# Patient Record
Sex: Male | Born: 1975 | Race: White | Hispanic: No | Marital: Married | Smoking: Never smoker
Health system: Southern US, Community
[De-identification: ages and names within clinical notes are randomized; demographics above are authoritative.]

## PROBLEM LIST (undated history)

## (undated) DIAGNOSIS — G4730 Sleep apnea, unspecified: Secondary | ICD-10-CM

## (undated) DIAGNOSIS — N200 Calculus of kidney: Secondary | ICD-10-CM

## (undated) DIAGNOSIS — I10 Essential (primary) hypertension: Secondary | ICD-10-CM

## (undated) DIAGNOSIS — R7303 Prediabetes: Secondary | ICD-10-CM

## (undated) DIAGNOSIS — D649 Anemia, unspecified: Secondary | ICD-10-CM

---

## 2006-01-21 ENCOUNTER — Emergency Department (HOSPITAL_COMMUNITY): Admission: EM | Admit: 2006-01-21 | Discharge: 2006-01-21 | Payer: Self-pay | Admitting: Emergency Medicine

## 2012-01-30 ENCOUNTER — Emergency Department (HOSPITAL_COMMUNITY): Payer: BC Managed Care – PPO

## 2012-01-30 ENCOUNTER — Emergency Department (HOSPITAL_COMMUNITY)
Admission: EM | Admit: 2012-01-30 | Discharge: 2012-01-30 | Disposition: A | Discharge status: Home / Self Care | Payer: BC Managed Care – PPO | Attending: Emergency Medicine | Admitting: Emergency Medicine

## 2012-01-30 ENCOUNTER — Encounter (HOSPITAL_COMMUNITY): Payer: Self-pay | Admitting: *Deleted

## 2012-01-30 DIAGNOSIS — N201 Calculus of ureter: Secondary | ICD-10-CM | POA: Insufficient documentation

## 2012-01-30 DIAGNOSIS — R109 Unspecified abdominal pain: Secondary | ICD-10-CM | POA: Insufficient documentation

## 2012-01-30 DIAGNOSIS — K7689 Other specified diseases of liver: Secondary | ICD-10-CM | POA: Insufficient documentation

## 2012-01-30 DIAGNOSIS — I10 Essential (primary) hypertension: Secondary | ICD-10-CM | POA: Insufficient documentation

## 2012-01-30 DIAGNOSIS — N200 Calculus of kidney: Secondary | ICD-10-CM

## 2012-01-30 HISTORY — DX: Essential (primary) hypertension: I10

## 2012-01-30 HISTORY — DX: Calculus of kidney: N20.0

## 2012-01-30 LAB — BASIC METABOLIC PANEL
BUN: 19 mg/dL (ref 6–23)
GFR calc Af Amer: >90 mL/min (ref >90)
Potassium: 4 mEq/L (ref 3.5–5.1)
Sodium: 139 mEq/L (ref 135–145)

## 2012-01-30 LAB — URINALYSIS, ROUTINE W REFLEX MICROSCOPIC
Bilirubin Urine: NEGATIVE
Glucose, UA: NEGATIVE mg/dL
Specific Gravity, Urine: 1.021 (ref 1.005–1.030)

## 2012-01-30 LAB — URINE MICROSCOPIC-ADD ON

## 2012-01-30 MED ORDER — SODIUM CHLORIDE 0.9 % IV BOLUS (SEPSIS)
1000.0000 mL | Freq: Once | INTRAVENOUS | Status: AC
  Administered 2012-01-30: 1000 mL via INTRAVENOUS

## 2012-01-30 MED ORDER — MORPHINE SULFATE 4 MG/ML IJ SOLN
4.0000 mg | Freq: Once | INTRAMUSCULAR | Status: AC
  Administered 2012-01-30: 4 mg via INTRAVENOUS
  Filled 2012-01-30: qty 1

## 2012-01-30 MED ORDER — ONDANSETRON HCL 4 MG/2ML IJ SOLN
4.0000 mg | Freq: Once | INTRAMUSCULAR | Status: AC
  Administered 2012-01-30: 4 mg via INTRAVENOUS
  Filled 2012-01-30: qty 2

## 2012-01-30 MED ORDER — ONDANSETRON 8 MG PO TBDP: 8.0000 mg | ORAL_TABLET | Freq: Three times a day (TID) | ORAL | Status: AC | PRN

## 2012-01-30 MED ORDER — KETOROLAC TROMETHAMINE 30 MG/ML IJ SOLN
30.0000 mg | Freq: Once | INTRAMUSCULAR | Status: AC
  Administered 2012-01-30: 30 mg via INTRAVENOUS
  Filled 2012-01-30: qty 1

## 2012-01-30 MED ORDER — OXYCODONE-ACETAMINOPHEN 5-325 MG PO TABS: 2.0000 | ORAL_TABLET | Freq: Four times a day (QID) | ORAL | Status: AC | PRN

## 2012-01-30 NOTE — ED Provider Notes (Signed)
History     CSN: [id number]  Arrival date & time 01/30/12  1320   First MD Initiated Contact with Patient 01/30/12 1454      Chief Complaint  Patient presents with  . Flank Pain  . Nausea  . Groin Pain    (Consider location/radiation/quality/duration/timing/severity/associated sxs/prior treatment) HPI Patient is a 36 year old male who presents today complaining of right lower flank pain radiating around to the abdomen and into the right testicle. Patient reports his pain as a 10 out of 10 and began today. He admitted to some fracture yesterday and had some pain at that time but says that today this feels quite different. He has a history of nephrolithiasis with largest stone being 8 mm. Patient endorses darkening of his urine as well as difficulty with urination. He denies any dysuria or hematuria. Patient has taken over-the-counter medications without any relief. He endorses nausea but denies any vomiting or fevers. There are no other associated or modifying factors Past Medical History  Diagnosis Date  . Kidney stones   . Hypertension     History reviewed. No pertinent past surgical history.  History reviewed. No pertinent family history.  History  Substance Use Topics  . Smoking status: Never Smoker   . Smokeless tobacco: Not on file  . Alcohol Use: Yes     occ      Review of Systems  Constitutional: Negative.   HENT: Negative.   Eyes: Negative.   Respiratory: Negative.   Cardiovascular: Negative.   Gastrointestinal: Negative.   Genitourinary: Positive for flank pain, decreased urine volume and testicular pain.  Musculoskeletal: Negative.   Skin: Negative.   Neurological: Negative.   Hematological: Negative.   Psychiatric/Behavioral: Negative.   All other systems reviewed and are negative.    Allergies  Review of patient's allergies indicates no known allergies.  Home Medications   Current Outpatient Rx  Name Route Sig Dispense Refill  . FENOFIBRATE  48 MG PO TABS Oral Take 48 mg by mouth daily.    Marland Kitchen NAPROXEN SODIUM 220 MG PO TABS Oral Take 220-440 mg by mouth 2 (two) times daily as needed. For pain      BP 139/82  Pulse 53  Temp(Src) 97.6 F (36.4 C) (Oral)  Resp 18  SpO2 98%  Physical Exam  Nursing note and vitals reviewed. GEN: Well-developed, well-nourished male in no distress, uncomfortable appearing HEENT: Atraumatic, normocephalic. Oropharynx clear without erythema EYES: PERRLA BL, no scleral icterus. NECK: Trachea midline, no meningismus CV: regular rate and rhythm. No murmurs, rubs, or gallops PULM: No respiratory distress.  No crackles, wheezes, or rales. GI: soft, non-tender. No guarding, rebound, or tenderness. + bowel sounds  GU: Right lower flank pain Neuro: cranial nerves 2-12 intact, no abnormalities of strength or sensation, A and O x 3 MSK: Patient moves all 4 extremities symmetrically, no deformity, edema, or injury noted Skin: No rashes petechiae, purpura, or jaundice Psych: no abnormality of mood   ED Course  Procedures (including critical care time)  Labs Reviewed  URINALYSIS, ROUTINE W REFLEX MICROSCOPIC - Abnormal; Notable for the following:    Color, Urine AMBER (*) BIOCHEMICALS MAY BE AFFECTED BY COLOR   APPearance CLOUDY (*)    Hgb urine dipstick LARGE (*)    Protein, ur 30 (*)    All other components within normal limits  BASIC METABOLIC PANEL - Abnormal; Notable for the following:    Glucose, Bld 121 (*)    GFR calc non Af Amer 88 (*)  All other components within normal limits  URINE MICROSCOPIC-ADD ON - Abnormal; Notable for the following:    Bacteria, UA FEW (*)    All other components within normal limits   Ct Abdomen Pelvis Wo Contrast  01/30/2012  *RADIOLOGY REPORT*  Clinical Data: Right flank and lower pelvic pain extending into the right scrotum.  History of nephrolithiasis.  CT ABDOMEN AND PELVIS WITHOUT CONTRAST  Technique:  Multidetector CT imaging of the abdomen and pelvis  was performed following the standard protocol without intravenous contrast.  Comparison: 01/21/2006.  Findings: Diffuse low density of the liver relative to the spleen. Minimal dilatation of the right renal collecting system and ureter to the level of a 4 mm calculus in the proximal ureter.  Normal appearing left kidney.  No calculi seen in either kidney, left ureter or urinary bladder.  Normal appearing appendix in the upper right pelvis.  Normal sized prostate gland.  No gastrointestinal abnormalities or enlarged lymph nodes.  Unremarkable noncontrasted appearance of the spleen, pancreas, gallbladder, adrenal glands and lung bases.  L1 vertebral bone island.  IMPRESSION:  1.  4 mm proximal right ureteral calculus causing minimal right hydronephrosis. 2.  Diffuse hepatic steatosis.  Original Report Authenticated By: Darrol Angel, M.D.     1. Nephrolithiasis       MDM  Patient was evaluated by myself. Based on his history a urinalysis, basic metabolic panel, and CT of the abdomen and pelvis noncontrast ordered. Patient was given IV fluids, morphine, and Zofran. Kidney function was preserved. Patient had largest stone measuring 4 mm on the right. Patient was feeling much better. A dose of Toradol was given. The patient was discharged with Percocet and Zofran as well as information for urology should he require this. Urinalysis showed no signs of infection. Patient was discharged in good condition.        Cyndra Numbers, MD 01/30/12 1744

## 2012-01-30 NOTE — ED Notes (Signed)
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 Pt reports pain is sharp on right flank and shooting into right groin. Pt reports last kidney stone was six years ago. Pt reports more difficult to urinate in the past few days, but denies blood in urine.  Pain is described as pulsing, but constant. Pt reports nausea without vomiting.  Pt has taken OTC pain medications without relief.

## 2012-01-30 NOTE — Discharge Instructions (Signed)

## 2014-03-09 ENCOUNTER — Emergency Department (HOSPITAL_COMMUNITY): Payer: BC Managed Care – PPO | Admitting: Certified Registered"

## 2014-03-09 ENCOUNTER — Encounter (HOSPITAL_COMMUNITY): Payer: Self-pay | Admitting: Emergency Medicine

## 2014-03-09 ENCOUNTER — Ambulatory Visit
Admission: RE | Admit: 2014-03-09 | Discharge: 2014-03-09 | Disposition: A | Discharge status: Home / Self Care | Payer: BC Managed Care – PPO | Source: Ambulatory Visit | Attending: Family Medicine | Admitting: Family Medicine

## 2014-03-09 ENCOUNTER — Ambulatory Visit (HOSPITAL_COMMUNITY)
Admission: EM | Admit: 2014-03-09 | Discharge: 2014-03-10 | Disposition: A | Discharge status: Home / Self Care | Payer: BC Managed Care – PPO | Attending: Emergency Medicine | Admitting: Emergency Medicine

## 2014-03-09 ENCOUNTER — Encounter (HOSPITAL_COMMUNITY): Payer: BC Managed Care – PPO | Admitting: Certified Registered"

## 2014-03-09 ENCOUNTER — Encounter (HOSPITAL_COMMUNITY)
Admission: EM | Disposition: A | Discharge status: Home / Self Care | Payer: Self-pay | Source: Home / Self Care | Attending: Emergency Medicine

## 2014-03-09 ENCOUNTER — Other Ambulatory Visit: Payer: Self-pay | Admitting: Family Medicine

## 2014-03-09 DIAGNOSIS — K3580 Unspecified acute appendicitis: Secondary | ICD-10-CM | POA: Insufficient documentation

## 2014-03-09 DIAGNOSIS — I10 Essential (primary) hypertension: Secondary | ICD-10-CM | POA: Insufficient documentation

## 2014-03-09 DIAGNOSIS — R197 Diarrhea, unspecified: Secondary | ICD-10-CM

## 2014-03-09 DIAGNOSIS — R109 Unspecified abdominal pain: Secondary | ICD-10-CM

## 2014-03-09 DIAGNOSIS — E785 Hyperlipidemia, unspecified: Secondary | ICD-10-CM | POA: Insufficient documentation

## 2014-03-09 DIAGNOSIS — R1031 Right lower quadrant pain: Secondary | ICD-10-CM

## 2014-03-09 HISTORY — PX: LAPAROSCOPIC APPENDECTOMY: SHX408

## 2014-03-09 LAB — CBC WITH DIFFERENTIAL/PLATELET
Basophils Absolute: 0 10*3/uL (ref 0.0–0.1)
Basophils Relative: 0 % (ref 0–1)
EOS PCT: 1 % (ref 0–5)
Eosinophils Absolute: 0.1 10*3/uL (ref 0.0–0.7)
HCT: 38.7 % — ABNORMAL LOW (ref 39.0–52.0)
Hemoglobin: 13.5 g/dL (ref 13.0–17.0)
LYMPHS ABS: 2.6 10*3/uL (ref 0.7–4.0)
LYMPHS PCT: 28 % (ref 12–46)
MCH: 29.4 pg (ref 26.0–34.0)
MCHC: 34.9 g/dL (ref 30.0–36.0)
MCV: 84.3 fL (ref 78.0–100.0)
MONO ABS: 0.5 10*3/uL (ref 0.1–1.0)
MONOS PCT: 6 % (ref 3–12)
NEUTROS PCT: 65 % (ref 43–77)
Neutro Abs: 6 10*3/uL (ref 1.7–7.7)
PLATELETS: 219 10*3/uL (ref 150–400)
RBC: 4.59 MIL/uL (ref 4.22–5.81)
RDW: 12.3 % (ref 11.5–15.5)
WBC: 9.3 10*3/uL (ref 4.0–10.5)

## 2014-03-09 LAB — I-STAT CHEM 8, ED
BUN: 7 mg/dL (ref 6–23)
CHLORIDE: 101 meq/L (ref 96–112)
CREATININE: 1 mg/dL (ref 0.50–1.35)
Calcium, Ion: 1.19 mmol/L (ref 1.12–1.23)
Glucose, Bld: 92 mg/dL (ref 70–99)
HEMATOCRIT: 40 % (ref 39.0–52.0)
HEMOGLOBIN: 13.6 g/dL (ref 13.0–17.0)
Potassium: 3.8 mEq/L (ref 3.7–5.3)
SODIUM: 140 meq/L (ref 137–147)
TCO2: 25 mmol/L (ref 0–100)

## 2014-03-09 SURGERY — APPENDECTOMY, LAPAROSCOPIC
Anesthesia: General | Site: Abdomen

## 2014-03-09 MED ORDER — KCL IN DEXTROSE-NACL 20-5-0.45 MEQ/L-%-% IV SOLN
INTRAVENOUS | Status: DC
  Administered 2014-03-09: 100 mL/h via INTRAVENOUS
  Administered 2014-03-10: 09:00:00 via INTRAVENOUS
  Filled 2014-03-09 (×4): qty 1000

## 2014-03-09 MED ORDER — LIDOCAINE HCL (CARDIAC) 20 MG/ML IV SOLN
INTRAVENOUS | Status: DC | PRN
  Administered 2014-03-09: 100 mg via INTRAVENOUS

## 2014-03-09 MED ORDER — DEXTROSE 5 % IV SOLN
1.0000 g | Freq: Once | INTRAVENOUS | Status: AC
  Administered 2014-03-09: 1 g via INTRAVENOUS

## 2014-03-09 MED ORDER — METRONIDAZOLE IN NACL 5-0.79 MG/ML-% IV SOLN
500.0000 mg | Freq: Once | INTRAVENOUS | Status: AC
  Administered 2014-03-09: 500 mg via INTRAVENOUS

## 2014-03-09 MED ORDER — SUCCINYLCHOLINE CHLORIDE 20 MG/ML IJ SOLN
INTRAMUSCULAR | Status: DC | PRN
  Administered 2014-03-09: 100 mg via INTRAVENOUS

## 2014-03-09 MED ORDER — DIPHENHYDRAMINE HCL 50 MG/ML IJ SOLN: 12.5000 mg | Freq: Four times a day (QID) | INTRAMUSCULAR | Status: DC | PRN

## 2014-03-09 MED ORDER — BUPIVACAINE-EPINEPHRINE 0.25% -1:200000 IJ SOLN
INTRAMUSCULAR | Status: DC | PRN
  Administered 2014-03-09: 10 mL

## 2014-03-09 MED ORDER — IOHEXOL 300 MG/ML  SOLN
100.0000 mL | Freq: Once | INTRAMUSCULAR | Status: AC | PRN
  Administered 2014-03-09: 100 mL via INTRAVENOUS

## 2014-03-09 MED ORDER — KCL IN DEXTROSE-NACL 20-5-0.45 MEQ/L-%-% IV SOLN
INTRAVENOUS | Status: AC
  Filled 2014-03-09: qty 1000

## 2014-03-09 MED ORDER — GLYCOPYRROLATE 0.2 MG/ML IJ SOLN
INTRAMUSCULAR | Status: DC | PRN
Start: 2014-03-09 — End: 2014-03-09
  Administered 2014-03-09: 0.4 mg via INTRAVENOUS
  Administered 2014-03-09: 0.2 mg via INTRAVENOUS

## 2014-03-09 MED ORDER — OXYCODONE-ACETAMINOPHEN 5-325 MG PO TABS
1.0000 | ORAL_TABLET | ORAL | Status: DC | PRN
  Administered 2014-03-10 (×2): 2 via ORAL
  Filled 2014-03-09 (×2): qty 2

## 2014-03-09 MED ORDER — DEXTROSE 5 % IV SOLN
2.0000 g | Freq: Once | INTRAVENOUS | Status: AC
  Administered 2014-03-09: 2 g via INTRAVENOUS
  Filled 2014-03-09: qty 2

## 2014-03-09 MED ORDER — ONDANSETRON HCL 4 MG/2ML IJ SOLN
INTRAMUSCULAR | Status: AC
  Filled 2014-03-09: qty 2

## 2014-03-09 MED ORDER — FENOFIBRATE 54 MG PO TABS
54.0000 mg | ORAL_TABLET | Freq: Every day | ORAL | Status: DC
  Administered 2014-03-09: 54 mg via ORAL
  Filled 2014-03-09 (×2): qty 1

## 2014-03-09 MED ORDER — LACTATED RINGERS IV SOLN
INTRAVENOUS | Status: DC | PRN
  Administered 2014-03-09 (×2): via INTRAVENOUS

## 2014-03-09 MED ORDER — PROMETHAZINE HCL 25 MG/ML IJ SOLN: 6.2500 mg | INTRAMUSCULAR | Status: DC | PRN

## 2014-03-09 MED ORDER — ACETAMINOPHEN 325 MG PO TABS: 325.0000 mg | ORAL_TABLET | Freq: Four times a day (QID) | ORAL | Status: DC | PRN

## 2014-03-09 MED ORDER — BUPIVACAINE-EPINEPHRINE (PF) 0.25% -1:200000 IJ SOLN
INTRAMUSCULAR | Status: AC
  Filled 2014-03-09: qty 30

## 2014-03-09 MED ORDER — HYDROMORPHONE HCL PF 1 MG/ML IJ SOLN
0.2500 mg | INTRAMUSCULAR | Status: DC | PRN
  Administered 2014-03-09 (×2): 0.5 mg via INTRAVENOUS

## 2014-03-09 MED ORDER — SUFENTANIL CITRATE 50 MCG/ML IV SOLN
INTRAVENOUS | Status: DC | PRN
  Administered 2014-03-09 (×2): 10 ug via INTRAVENOUS

## 2014-03-09 MED ORDER — PROPOFOL 10 MG/ML IV BOLUS
INTRAVENOUS | Status: DC | PRN
  Administered 2014-03-09: 160 mg via INTRAVENOUS

## 2014-03-09 MED ORDER — MORPHINE SULFATE 2 MG/ML IJ SOLN: 1.0000 mg | INTRAMUSCULAR | Status: DC | PRN

## 2014-03-09 MED ORDER — NEOSTIGMINE METHYLSULFATE 10 MG/10ML IV SOLN
INTRAVENOUS | Status: AC
  Filled 2014-03-09: qty 1

## 2014-03-09 MED ORDER — GLYCOPYRROLATE 0.2 MG/ML IJ SOLN
INTRAMUSCULAR | Status: AC
Start: 2014-03-09 — End: 2014-03-09
  Filled 2014-03-09: qty 2

## 2014-03-09 MED ORDER — OXYCODONE HCL 5 MG/5ML PO SOLN: 5.0000 mg | Freq: Once | ORAL | Status: DC | PRN

## 2014-03-09 MED ORDER — HYDROMORPHONE HCL PF 1 MG/ML IJ SOLN
INTRAMUSCULAR | Status: AC
  Filled 2014-03-09: qty 1

## 2014-03-09 MED ORDER — SODIUM CHLORIDE 0.9 % IJ SOLN
INTRAMUSCULAR | Status: AC
  Filled 2014-03-09: qty 10

## 2014-03-09 MED ORDER — NEOSTIGMINE METHYLSULFATE 10 MG/10ML IV SOLN
INTRAVENOUS | Status: DC | PRN
  Administered 2014-03-09: 3 mg via INTRAVENOUS

## 2014-03-09 MED ORDER — DEXAMETHASONE SODIUM PHOSPHATE 4 MG/ML IJ SOLN
INTRAMUSCULAR | Status: DC | PRN
Start: 2014-03-09 — End: 2014-03-09
  Administered 2014-03-09: 4 mg via INTRAVENOUS

## 2014-03-09 MED ORDER — ONDANSETRON HCL 4 MG/2ML IJ SOLN: 4.0000 mg | Freq: Four times a day (QID) | INTRAMUSCULAR | Status: DC | PRN

## 2014-03-09 MED ORDER — MIDAZOLAM HCL 2 MG/2ML IJ SOLN
INTRAMUSCULAR | Status: AC
  Filled 2014-03-09: qty 2

## 2014-03-09 MED ORDER — SUFENTANIL CITRATE 50 MCG/ML IV SOLN
INTRAVENOUS | Status: AC
  Filled 2014-03-09: qty 1

## 2014-03-09 MED ORDER — ONDANSETRON HCL 4 MG PO TABS: 4.0000 mg | ORAL_TABLET | Freq: Four times a day (QID) | ORAL | Status: DC | PRN

## 2014-03-09 MED ORDER — ROCURONIUM BROMIDE 100 MG/10ML IV SOLN
INTRAVENOUS | Status: DC | PRN
  Administered 2014-03-09: 30 mg via INTRAVENOUS

## 2014-03-09 MED ORDER — MIDAZOLAM HCL 5 MG/5ML IJ SOLN
INTRAMUSCULAR | Status: DC | PRN
  Administered 2014-03-09: 2 mg via INTRAVENOUS

## 2014-03-09 MED ORDER — KETOROLAC TROMETHAMINE 15 MG/ML IJ SOLN: 15.0000 mg | Freq: Four times a day (QID) | INTRAMUSCULAR | Status: DC | PRN

## 2014-03-09 MED ORDER — METRONIDAZOLE IN NACL 5-0.79 MG/ML-% IV SOLN
500.0000 mg | Freq: Three times a day (TID) | INTRAVENOUS | Status: AC
  Administered 2014-03-09 – 2014-03-10 (×2): 500 mg via INTRAVENOUS
  Filled 2014-03-09 (×2): qty 100

## 2014-03-09 MED ORDER — ENOXAPARIN SODIUM 40 MG/0.4ML ~~LOC~~ SOLN
40.0000 mg | SUBCUTANEOUS | Status: DC
  Filled 2014-03-09: qty 0.4

## 2014-03-09 MED ORDER — KETOROLAC TROMETHAMINE 15 MG/ML IJ SOLN
15.0000 mg | Freq: Four times a day (QID) | INTRAMUSCULAR | Status: DC
  Administered 2014-03-10 (×2): 15 mg via INTRAVENOUS
  Filled 2014-03-09 (×3): qty 1

## 2014-03-09 MED ORDER — OXYCODONE HCL 5 MG PO TABS: 5.0000 mg | ORAL_TABLET | Freq: Once | ORAL | Status: DC | PRN

## 2014-03-09 MED ORDER — SODIUM CHLORIDE 0.9 % IR SOLN
Status: DC | PRN
  Administered 2014-03-09: 1000 mL

## 2014-03-09 MED ORDER — ONDANSETRON HCL 4 MG/2ML IJ SOLN
INTRAMUSCULAR | Status: DC | PRN
  Administered 2014-03-09: 4 mg via INTRAVENOUS

## 2014-03-09 SURGICAL SUPPLY — 39 items
APPLIER CLIP ROT 10 11.4 M/L (STAPLE)
BLADE SURG ROTATE 9660 (MISCELLANEOUS) IMPLANT
CANISTER SUCTION 2500CC (MISCELLANEOUS) ×3 IMPLANT
CHLORAPREP W/TINT 26ML (MISCELLANEOUS) ×3 IMPLANT
CLIP APPLIE ROT 10 11.4 M/L (STAPLE) IMPLANT
COVER SURGICAL LIGHT HANDLE (MISCELLANEOUS) ×3 IMPLANT
CUTTER FLEX LINEAR 45M (STAPLE) ×3 IMPLANT
DERMABOND ADVANCED (GAUZE/BANDAGES/DRESSINGS) ×2
DERMABOND ADVANCED .7 DNX12 (GAUZE/BANDAGES/DRESSINGS) ×1 IMPLANT
DRAPE UTILITY 15X26 W/TAPE STR (DRAPE) ×6 IMPLANT
DRAPE WARM FLUID 44X44 (DRAPE) ×3 IMPLANT
ELECT REM PT RETURN 9FT ADLT (ELECTROSURGICAL) ×3
ELECTRODE REM PT RTRN 9FT ADLT (ELECTROSURGICAL) ×1 IMPLANT
ENDOLOOP SUT PDS II  0 18 (SUTURE)
ENDOLOOP SUT PDS II 0 18 (SUTURE) IMPLANT
GLOVE BIO SURGEON STRL SZ 6 (GLOVE) ×3 IMPLANT
GLOVE BIOGEL PI IND STRL 6.5 (GLOVE) ×1 IMPLANT
GLOVE BIOGEL PI INDICATOR 6.5 (GLOVE) ×2
GOWN STRL REUS W/ TWL LRG LVL3 (GOWN DISPOSABLE) ×2 IMPLANT
GOWN STRL REUS W/TWL 2XL LVL3 (GOWN DISPOSABLE) ×6 IMPLANT
GOWN STRL REUS W/TWL LRG LVL3 (GOWN DISPOSABLE) ×4
KIT BASIN OR (CUSTOM PROCEDURE TRAY) ×3 IMPLANT
KIT ROOM TURNOVER OR (KITS) ×3 IMPLANT
NS IRRIG 1000ML POUR BTL (IV SOLUTION) ×3 IMPLANT
PAD ARMBOARD 7.5X6 YLW CONV (MISCELLANEOUS) ×6 IMPLANT
POUCH SPECIMEN RETRIEVAL 10MM (ENDOMECHANICALS) ×3 IMPLANT
RELOAD STAPLE TA45 3.5 REG BLU (ENDOMECHANICALS) ×3 IMPLANT
SCALPEL HARMONIC ACE (MISCELLANEOUS) ×3 IMPLANT
SET IRRIG TUBING LAPAROSCOPIC (IRRIGATION / IRRIGATOR) ×3 IMPLANT
SLEEVE ENDOPATH XCEL 5M (ENDOMECHANICALS) ×3 IMPLANT
SPECIMEN JAR SMALL (MISCELLANEOUS) ×3 IMPLANT
SUT MNCRL AB 4-0 PS2 18 (SUTURE) ×3 IMPLANT
TOWEL OR 17X24 6PK STRL BLUE (TOWEL DISPOSABLE) ×3 IMPLANT
TOWEL OR 17X26 10 PK STRL BLUE (TOWEL DISPOSABLE) ×3 IMPLANT
TRAY FOLEY CATH 16FR SILVER (SET/KITS/TRAYS/PACK) ×3 IMPLANT
TRAY LAPAROSCOPIC (CUSTOM PROCEDURE TRAY) ×3 IMPLANT
TROCAR XCEL BLUNT TIP 100MML (ENDOMECHANICALS) ×3 IMPLANT
TROCAR XCEL NON-BLD 5MMX100MML (ENDOMECHANICALS) ×3 IMPLANT
WATER STERILE IRR 1000ML POUR (IV SOLUTION) IMPLANT

## 2014-03-09 NOTE — Anesthesia Preprocedure Evaluation (Signed)
Anesthesia Evaluation  Patient identified by MRN, date of birth, ID band Patient awake    Reviewed: Allergy & Precautions, H&P , NPO status , Patient's Chart, lab work & pertinent test results  History of Anesthesia Complications Negative for: history of anesthetic complications  Airway Mallampati: I  Neck ROM: Full    Dental no notable dental hx. (+) Teeth Intact   Pulmonary neg pulmonary ROS,  breath sounds clear to auscultation  Pulmonary exam normal       Cardiovascular hypertension, negative cardio ROS  IRhythm:Regular Rate:Normal     Neuro/Psych negative neurological ROS  negative psych ROS   GI/Hepatic negative GI ROS, Neg liver ROS,   Endo/Other  negative endocrine ROS  Renal/GU negative Renal ROS  negative genitourinary   Musculoskeletal   Abdominal   Peds  Hematology negative hematology ROS (+)   Anesthesia Other Findings   Reproductive/Obstetrics negative OB ROS                           Anesthesia Physical Anesthesia Plan  ASA: I  Anesthesia Plan: General   Post-op Pain Management:    Induction: Intravenous  Airway Management Planned: Oral ETT  Additional Equipment:   Intra-op Plan:   Post-operative Plan: Extubation in OR  Informed Consent: I have reviewed the patients History and Physical, chart, labs and discussed the procedure including the risks, benefits and alternatives for the proposed anesthesia with the patient or authorized representative who has indicated his/her understanding and acceptance.   Dental advisory given  Plan Discussed with: CRNA and Surgeon  Anesthesia Plan Comments:         Anesthesia Quick Evaluation

## 2014-03-09 NOTE — Anesthesia Procedure Notes (Signed)
Procedure Name: Intubation Date/Time: 03/09/2014 7:51 PM Performed by: Claris Che Pre-anesthesia Checklist: Patient identified, Emergency Drugs available, Suction available and Patient being monitored Patient Re-evaluated:Patient Re-evaluated prior to inductionOxygen Delivery Method: Circle system utilized Preoxygenation: Pre-oxygenation with 100% oxygen Intubation Type: IV induction, Rapid sequence and Cricoid Pressure applied Ventilation: Mask ventilation without difficulty Laryngoscope Size: Mac and 3 Grade View: Grade II Tube type: Oral Tube size: 8.0 mm Number of attempts: 1 Airway Equipment and Method: Stylet Placement Confirmation: ETT inserted through vocal cords under direct vision,  positive ETCO2 and breath sounds checked- equal and bilateral Secured at: 23 cm Tube secured with: Tape Dental Injury: Teeth and Oropharynx as per pre-operative assessment

## 2014-03-09 NOTE — ED Provider Notes (Addendum)
Medical screening examination/treatment/procedure(s) were conducted as a shared visit with resident physician and myself.  I personally evaluated the patient during the encounter.  Gradual onset constant mild right lower quadrant pain over the last 4 days with no rebound on examination only mild right lower quadrant tenderness the rest of the abdomen is nontender CT scan shows appendicitis performed prior to patient arrival to the emergency department.  I saw and evaluated the patient, reviewed the resident's note and I agree with the findings and plan.   EKG Interpretation None      Babette Relic, MD 03/10/14 [place], MD 03/10/14 1329

## 2014-03-09 NOTE — ED Provider Notes (Signed)
CSN: [id number]     Arrival date & time 03/09/14  1540 History   First MD Initiated Contact with Patient 03/09/14 1733     Chief Complaint  Patient presents with  . appendicitis      (Consider location/radiation/quality/duration/timing/severity/associated sxs/prior Treatment) HPI   Patient is a 38 year old male with a past medical history of hyperlipidemia and hypertension, who presents at the request of his PCP after having a CT scan earlier today which was diagnostic for acute appendicitis. Patient states that he began having abdominal pain in his right lower quadrant on Tuesday evening. He developed chills on Wednesday evening but has not had any known fevers. No nausea or vomiting. Has had some decreased oral intake. Last ate a meal yesterday evening. He has been drinking fluids today. He has never had any prior abdominal surgeries. He was seen at his primary care provider's office today, and had a CT scan done this afternoon, after which his physician called him and asked him to come to the emergency room as the CT scan showed appendicitis.  Past Medical History  Diagnosis Date  . Kidney stones   . Hypertension    History reviewed. No pertinent past surgical history. No family history on file. History  Substance Use Topics  . Smoking status: Never Smoker   . Smokeless tobacco: Not on file  . Alcohol Use: Yes     Comment: occ    Review of Systems  Constitutional: Positive for chills and appetite change. Negative for fever.  Gastrointestinal: Positive for abdominal pain. Negative for nausea and vomiting.  All other systems reviewed and are negative.     Allergies  Review of patient's allergies indicates no known allergies.  Home Medications   Prior to Admission medications   Medication Sig Start Date End Date Taking? Authorizing Provider  fenofibrate (TRICOR) 48 MG tablet Take 48 mg by mouth daily.    Historical Provider, MD  naproxen sodium (ANAPROX) 220 MG tablet Take  220-440 mg by mouth 2 (two) times daily as needed. For pain    Historical Provider, MD   BP 141/96  Pulse 92  Temp(Src) 98.1 F (36.7 C)  Resp 18  Ht 5\' 10"  (1.778 m)  Wt 216 lb (97.977 kg)  BMI 30.99 kg/m2  SpO2 98% Physical Exam  Constitutional: He is oriented to person, place, and time. He appears well-developed and well-nourished. No distress.  HENT:  Head: Normocephalic and atraumatic.  Mouth/Throat: Oropharynx is clear and moist.  Eyes: Pupils are equal, round, and reactive to light.  Cardiovascular: Normal rate, regular rhythm and normal heart sounds.   Pulmonary/Chest: Effort normal and breath sounds normal.  Abdominal: Soft. He exhibits no distension and no mass. There is tenderness. There is no rebound and no guarding.  TTP RLQ  Musculoskeletal: He exhibits no edema.  Neurological: He is alert and oriented to person, place, and time.  Skin: Skin is warm and dry. He is not diaphoretic.  Psychiatric: He has a normal mood and affect. His behavior is normal.    ED Course  Procedures (including critical care time) Labs Review Labs Reviewed  CBC WITH DIFFERENTIAL - Abnormal; Notable for the following:    HCT 38.7 (*)    All other components within normal limits    Imaging Review Ct Abdomen Pelvis W Contrast  03/09/2014   CLINICAL DATA:  Abdominal pain  EXAM: CT ABDOMEN AND PELVIS WITH CONTRAST  TECHNIQUE: Multidetector CT imaging of the abdomen and pelvis was performed using the  standard protocol following bolus administration of intravenous contrast. Oral contrast was also administered.  CONTRAST:  184mL OMNIPAQUE IOHEXOL 300 MG/ML  SOLN  COMPARISON:  January 30, 2012  FINDINGS: Lung bases are clear.  Liver is enlarged, measuring 22.8 cm in length. There is diffuse hepatic steatosis. No focal liver lesions are identified. There is no appreciable biliary duct dilatation. Gallbladder wall is not thickened.  Spleen is prominent, measuring 13.9 by 14.9 by 7.0 cm. No focal splenic  lesions are identified. There is a small splenule along the anterior inferior spleen.  Pancreas and adrenals appear normal. Kidneys bilaterally show no mass or hydronephrosis. There is no renal or ureteral calculus on either side.  In the pelvis, the urinary bladder is midline. There is no pelvic mass or fluid collection.  The appendix is dilated with periappendiceal inflammation. No abscess is seen in this region. There is some thickening of the wall of the cecum consistent with the adjacent appendicitis.  There is no bowel obstruction.  No free air or portal venous air.  There is no ascites, adenopathy, or abscess in the abdomen or pelvis. There is no evidence of abdominal aortic aneurysm. There are no blastic or lytic bone lesions.  IMPRESSION: Evidence of acute appendicitis.  No abscess.  Diffuse hepatic steatosis.  There is hepatomegaly and splenomegaly.  Critical Value/emergent results were called by telephone at the time of interpretation on 03/09/2014 at 3:24 PM to Dr. Lujean Amel , who verbally acknowledged these results.   Electronically Signed   By: Lowella Grip M.D.   On: 03/09/2014 15:24     EKG Interpretation None      MDM   Final diagnoses:  Acute appendicitis    38 year old male with right lower quadrant pain for a few days, with CT scan diagnostic for acute appendicitis. Will consult general surgery for likely operative management.  Chrisandra Netters, MD Family Medicine PGY-2   Leeanne Rio, MD 03/09/14 [phone]

## 2014-03-09 NOTE — Transfer of Care (Signed)
Immediate Anesthesia Transfer of Care Note  Patient: Noah Harris  Procedure(s) Performed: Procedure(s): APPENDECTOMY LAPAROSCOPIC (N/A)  Patient Location: PACU  Anesthesia Type:General  Level of Consciousness: awake, alert , oriented and patient cooperative  Airway & Oxygen Therapy: Patient Spontanous Breathing and Patient connected to nasal cannula oxygen  Post-op Assessment: Report given to PACU RN, Post -op Vital signs reviewed and stable and Patient moving all extremities X 4  Post vital signs: Reviewed and stable  Complications: No apparent anesthesia complications

## 2014-03-09 NOTE — H&P (Signed)
Noah Harris is an 38 y.o. male.   Chief Complaint: abdominal pain/acute appendicitis HPI:  Patient is 38 year old male who presents with 3 days of abdominal pain. He started having pain on the right side 3 days prior to admission. This became generalized across the pelvis on Wednesday and Thursday. He had chills on Wednesday. He has had some diarrhea, but he has not had any nausea or vomiting. He denies fever. He has had anorexia as well. Today however, he is hungry. He has not found anything that really exacerbates the pain other than moving. He has never had pain like this before. He has had kidney stones and thinks that this is not as bad as that is.  Past Medical History  Diagnosis Date  . Kidney stones   . Hypertension     History reviewed. No pertinent past surgical history.  No family history on file. Social History:  reports that he has never smoked. He does not have any smokeless tobacco history on file. He reports that he drinks alcohol. He reports that he does not use illicit drugs.  Allergies: No Known Allergies  Meds:  Tricor, anaprox  Results for orders placed during the hospital encounter of 03/09/14 (from the past 48 hour(s))  CBC WITH DIFFERENTIAL     Status: Abnormal   Collection Time    03/09/14  3:54 PM      Result Value Ref Range   WBC 9.3  4.0 - 10.5 K/uL   RBC 4.59  4.22 - 5.81 MIL/uL   Hemoglobin 13.5  13.0 - 17.0 g/dL   HCT 38.7 (*) 39.0 - 52.0 %   MCV 84.3  78.0 - 100.0 fL   MCH 29.4  26.0 - 34.0 pg   MCHC 34.9  30.0 - 36.0 g/dL   RDW 12.3  11.5 - 15.5 %   Platelets 219  150 - 400 K/uL   Neutrophils Relative % 65  43 - 77 %   Neutro Abs 6.0  1.7 - 7.7 K/uL   Lymphocytes Relative 28  12 - 46 %   Lymphs Abs 2.6  0.7 - 4.0 K/uL   Monocytes Relative 6  3 - 12 %   Monocytes Absolute 0.5  0.1 - 1.0 K/uL   Eosinophils Relative 1  0 - 5 %   Eosinophils Absolute 0.1  0.0 - 0.7 K/uL   Basophils Relative 0  0 - 1 %   Basophils Absolute 0.0  0.0 - 0.1 K/uL   I-STAT CHEM 8, ED     Status: None   Collection Time    03/09/14  6:28 PM      Result Value Ref Range   Sodium 140  137 - 147 mEq/L   Potassium 3.8  3.7 - 5.3 mEq/L   Chloride 101  96 - 112 mEq/L   BUN 7  6 - 23 mg/dL   Creatinine, Ser 1.00  0.50 - 1.35 mg/dL   Glucose, Bld 92  70 - 99 mg/dL   Calcium, Ion 1.19  1.12 - 1.23 mmol/L   TCO2 25  0 - 100 mmol/L   Hemoglobin 13.6  13.0 - 17.0 g/dL   HCT 40.0  39.0 - 52.0 %   Ct Abdomen Pelvis W Contrast  03/09/2014   CLINICAL DATA:  Abdominal pain  EXAM: CT ABDOMEN AND PELVIS WITH CONTRAST  TECHNIQUE: Multidetector CT imaging of the abdomen and pelvis was performed using the standard protocol following bolus administration of intravenous contrast. Oral contrast  was also administered.  CONTRAST:  145mL OMNIPAQUE IOHEXOL 300 MG/ML  SOLN  COMPARISON:  January 30, 2012  FINDINGS: Lung bases are clear.  Liver is enlarged, measuring 22.8 cm in length. There is diffuse hepatic steatosis. No focal liver lesions are identified. There is no appreciable biliary duct dilatation. Gallbladder wall is not thickened.  Spleen is prominent, measuring 13.9 by 14.9 by 7.0 cm. No focal splenic lesions are identified. There is a small splenule along the anterior inferior spleen.  Pancreas and adrenals appear normal. Kidneys bilaterally show no mass or hydronephrosis. There is no renal or ureteral calculus on either side.  In the pelvis, the urinary bladder is midline. There is no pelvic mass or fluid collection.  The appendix is dilated with periappendiceal inflammation. No abscess is seen in this region. There is some thickening of the wall of the cecum consistent with the adjacent appendicitis.  There is no bowel obstruction.  No free air or portal venous air.  There is no ascites, adenopathy, or abscess in the abdomen or pelvis. There is no evidence of abdominal aortic aneurysm. There are no blastic or lytic bone lesions.  IMPRESSION: Evidence of acute appendicitis.  No  abscess.  Diffuse hepatic steatosis.  There is hepatomegaly and splenomegaly.  Critical Value/emergent results were called by telephone at the time of interpretation on 03/09/2014 at 3:24 PM to Dr. Lujean Amel , who verbally acknowledged these results.   Electronically Signed   By: Lowella Grip M.D.   On: 03/09/2014 15:24    Review of Systems  Constitutional: Positive for chills. Negative for fever.       Anorexia  HENT: Negative.   Eyes: Negative.   Respiratory: Negative.   Cardiovascular: Negative.   Gastrointestinal: Positive for abdominal pain and diarrhea.  Genitourinary: Negative.   Musculoskeletal: Negative.   Skin: Negative.   Neurological: Negative.   Endo/Heme/Allergies: Negative.   Psychiatric/Behavioral: Negative.     Blood pressure 140/91, pulse 98, temperature 98.1 F (36.7 C), resp. rate 18, height 5\' 10"  (1.778 m), weight 216 lb (97.977 kg), SpO2 100.00%. Physical Exam  Constitutional: He is oriented to person, place, and time. He appears well-developed and well-nourished. No distress.  HENT:  Head: Normocephalic and atraumatic.  Eyes: Conjunctivae are normal. Pupils are equal, round, and reactive to light. No scleral icterus.  Neck: Normal range of motion. No thyromegaly present.  Cardiovascular: Normal rate, regular rhythm, normal heart sounds and intact distal pulses.  Exam reveals no gallop and no friction rub.   No murmur heard. Respiratory: Effort normal and breath sounds normal. No respiratory distress. He has no wheezes. He has no rales. He exhibits no tenderness.  GI: Soft. He exhibits no distension. There is tenderness (RLQ). There is no rebound and no guarding.  Musculoskeletal: Normal range of motion. He exhibits no edema and no tenderness.  Neurological: He is alert and oriented to person, place, and time. Coordination normal.  Skin: Skin is warm and dry. No rash noted. He is not diaphoretic. No erythema. No pallor.  Psychiatric: He has a normal  mood and affect. His behavior is normal. Judgment and thought content normal.     Assessment/Plan Acute appendicitis N.p.o., IV fluids, IV antibiotics, and laparoscopic appendectomy.  Appendectomy was described to the patient.  The incisions and surgical technique were explained.  The patient was advised that some of the hair on the abdomen would be clipped, and that a foley catheter would be placed.  I advised the patient of  the risks of surgery including, but not limited to, bleeding, infection, damage to other structures, risk of an open operation, risk of abscess, and risk of blood clot.  The recovery was also described to the patient.  He was advised that he will have lifting restrictions for 2 weeks.     Stark Klein 03/09/2014, 7:34 PM

## 2014-03-09 NOTE — Anesthesia Postprocedure Evaluation (Signed)
  Anesthesia Post-op Note  Patient: Noah Harris  Procedure(s) Performed: Procedure(s): APPENDECTOMY LAPAROSCOPIC (N/A)  Patient Location: PACU  Anesthesia Type:General  Level of Consciousness: awake and alert   Airway and Oxygen Therapy: Patient Spontanous Breathing  Post-op Pain: mild  Post-op Assessment: Post-op Vital signs reviewed  Post-op Vital Signs: stable  Last Vitals:  Filed Vitals:   03/09/14 2130  BP:   Pulse: 86  Temp: 36.6 C  Resp: 13    Complications: No apparent anesthesia complications

## 2014-03-09 NOTE — Op Note (Signed)
Appendectomy, Lap, Procedure Note  Indications: The patient presented with a history of right-sided abdominal pain. A CT revealed findings consistent with acute appendicitis.  Pre-operative Diagnosis: Acute appendicitis without mention of peritonitis  Post-operative Diagnosis: Same  Surgeon: Stark Klein   Anesthesia: General endotracheal anesthesia and Local anesthesia 1% plain lidocaine, 0.25.% bupivacaine, with epinephrine  ASA Class: 1  Procedure Details  The patient was seen again in the Holding Room. The risks, benefits, complications, treatment options, and expected outcomes were discussed with the patient and/or family. The possibilities of perforation of viscus, bleeding, recurrent infection, the need for additional procedures, failure to diagnose a condition, and creating a complication requiring transfusion or operation were discussed. There was concurrence with the proposed plan and informed consent was obtained. The site of surgery was properly noted. The patient was taken to Operating Room, identified as Noah Harris and the procedure verified as Appendectomy. A Time Out was held and the above information confirmed.  The patient was placed in the supine position and general anesthesia was induced, along with placement of orogastric tube, Venodyne boots, and a Foley catheter. The abdomen was prepped and draped in a sterile fashion. Local anesthetic was infiltrated in the infraumbilical region.  A 1.5 cm curvilinear transverse incision was made just below the umbilicus.  The Kelly clamp was used to spread the subcutaneous tissues.  The fascia was elevated with 2 Kocher clamps and incised with the #11 blade.  A Claiborne Billings was used to confirm entrance into the peritoneal cavity.  A pursestring suture was placed around the fascial incision.  The Hasson trocar was inserted into the abdomen and held in place with the tails of the suture.  The pneumoperitoneum was then established to steady  pressure of 15 mmHg.     Additional 5 mm cannulas then placed in the left lower quadrant of the abdomen and the suprapubic region under direct visualization.  A careful evaluation of the entire abdomen was carried out. The patient was placed in Trendelenburg and rotated to the left.  The small intestines were retracted in the cephalad and left lateral direction away from the pelvis and right lower quadrant. The patient was found to have an enlarged and inflamed appendix that was extending into the pelvis. There was a small amount of stool leakage from the appendix.  The appendix was carefully dissected. The appendix was was skeletonized with the harmonic scalpel.   The appendix was divided at its base using an endo-GIA stapler. Minimal appendiceal stump was left in place. The appendix was removed from the abdomen with an Endocatch bag through the umbilical port.  There was no evidence of bleeding, leakage, or complication after division of the appendix. Irrigation was also performed and irrigate suctioned from the abdomen as well.  The 5 mm trocars were removed.  The pneumoperitoneum was evacuated from the abdomen.    The trocar site skin wounds were closed with 4-0 Monocryl and dressed with Dermabond.  Instrument, sponge, and needle counts were correct at the conclusion of the case.   Findings: The appendix was found to be inflamed. There were not signs of necrosis.  There was perforation. There was not abscess formation.  Estimated Blood Loss:  Minimal            Specimens: appendix         Complications:  None; patient tolerated the procedure well.         Disposition: PACU - hemodynamically stable.  Condition: stable

## 2014-03-09 NOTE — ED Notes (Signed)
The pt has blood and urine this am in his [place] physicians office

## 2014-03-09 NOTE — ED Notes (Signed)
The pt has had rt abd pain since Tuesday .  He came here from a c-t scanner with a dx of appendicitis.   Last meal 1900

## 2014-03-09 NOTE — ED Notes (Signed)
MD at bedside. 

## 2014-03-10 LAB — BASIC METABOLIC PANEL
BUN: 9 mg/dL (ref 6–23)
CHLORIDE: 100 meq/L (ref 96–112)
CO2: 22 mEq/L (ref 19–32)
CREATININE: 0.95 mg/dL (ref 0.50–1.35)
Calcium: 8.8 mg/dL (ref 8.4–10.5)
GFR calc non Af Amer: >90 mL/min (ref >90)
Glucose, Bld: 140 mg/dL — ABNORMAL HIGH (ref 70–99)
Potassium: 4.3 mEq/L (ref 3.7–5.3)
Sodium: 138 mEq/L (ref 137–147)

## 2014-03-10 LAB — CBC
HEMATOCRIT: 37.1 % — AB (ref 39.0–52.0)
Hemoglobin: 12.7 g/dL — ABNORMAL LOW (ref 13.0–17.0)
MCH: 28.7 pg (ref 26.0–34.0)
MCHC: 34.2 g/dL (ref 30.0–36.0)
MCV: 83.9 fL (ref 78.0–100.0)
PLATELETS: 222 10*3/uL (ref 150–400)
RBC: 4.42 MIL/uL (ref 4.22–5.81)
RDW: 12 % (ref 11.5–15.5)
WBC: 10.7 10*3/uL — ABNORMAL HIGH (ref 4.0–10.5)

## 2014-03-10 MED ORDER — METRONIDAZOLE 500 MG PO TABS: 500.0000 mg | ORAL_TABLET | Freq: Three times a day (TID) | ORAL | Status: DC

## 2014-03-10 MED ORDER — CIPROFLOXACIN HCL 500 MG PO TABS: 500.0000 mg | ORAL_TABLET | Freq: Two times a day (BID) | ORAL | Status: DC

## 2014-03-10 MED ORDER — OXYCODONE-ACETAMINOPHEN 5-325 MG PO TABS: 1.0000 | ORAL_TABLET | ORAL | Status: DC | PRN

## 2014-03-10 NOTE — Discharge Instructions (Signed)
[facility] ______[facility] °LAPAROSCOPIC SURGERY: POST OP INSTRUCTIONS °Always review your discharge instruction sheet given to you by the facility where your surgery was performed. °IF YOU HAVE DISABILITY OR FAMILY LEAVE FORMS, YOU MUST BRING THEM TO THE OFFICE FOR PROCESSING.   °DO NOT GIVE THEM TO YOUR DOCTOR. ° °1. A prescription for pain medication may be given to you upon discharge.  Take your pain medication as prescribed, if needed.  If narcotic pain medicine is not needed, then you may take acetaminophen (Tylenol) or ibuprofen (Advil) as needed. °2. Take your usually prescribed medications unless otherwise directed. °3. If you need a refill on your pain medication, please contact your pharmacy.  They will contact our office to request authorization. Prescriptions will not be filled after 5pm or on week-ends. °4. You should follow a light diet the first few days after arrival home, such as soup and crackers, etc.  Be sure to include lots of fluids daily. °5. Most patients will experience some swelling and bruising in the area of the incisions.  Ice packs will help.  Swelling and bruising can take several days to resolve.  °6. It is common to experience some constipation if taking pain medication after surgery.  Increasing fluid intake and taking a stool softener (such as Colace) will usually help or prevent this problem from occurring.  A mild laxative (Milk of Magnesia or Miralax) should be taken according to package instructions if there are no bowel movements after 48 hours. °7. Unless discharge instructions indicate otherwise, you may remove your bandages 24-48 hours after surgery, and you may shower at that time.  You may have steri-strips (small skin tapes) in place directly over the incision.  These strips should be left on the skin for 7-10 days.  If your surgeon used skin glue on the incision, you may shower in 24 hours.  The glue will flake off over the next 2-3 weeks.  Any sutures or  staples will be removed at the office during your follow-up visit. °8. ACTIVITIES:  You may resume regular (light) daily activities beginning the next day--such as daily self-care, walking, climbing stairs--gradually increasing activities as tolerated.  You may have sexual intercourse when it is comfortable.  Refrain from any heavy lifting or straining until approved by your doctor. °a. You may drive when you are no longer taking prescription pain medication, you can comfortably wear a seatbelt, and you can safely maneuver your car and apply brakes. °b. RETURN TO WORK:  __________________________________________________________ °9. You should see your doctor in the office for a follow-up appointment approximately 2-3 weeks after your surgery.  Make sure that you call for this appointment within a day or two after you arrive home to insure a convenient appointment time. °10. OTHER INSTRUCTIONS: __________________________________________________________________________________________________________________________ __________________________________________________________________________________________________________________________ °WHEN TO CALL YOUR DOCTOR: °1. Fever over 101.0 °2. Inability to urinate °3. Continued bleeding from incision. °4. Increased pain, redness, or drainage from the incision. °5. Increasing abdominal pain ° °The clinic staff is available to answer your questions during regular business hours.  Please don’t hesitate to call and ask to speak to one of the nurses for clinical concerns.  If you have a medical emergency, go to the nearest emergency room or call 911.  A surgeon from [facility] is always on call at the hospital. °[address], [address], [place], [place]  [address] ? [address] [address], [place],    [address] °[phone] ? [phone] ? FAX [phone] °Web site:   [email or web address] °

## 2014-03-10 NOTE — Progress Notes (Signed)
Pt for discharge to home accomp by wife who is a Marine scientist.  Rx given to pt and explained.  Pt is to call office on Monday to get FU appt.  Pt has # to call for any problems, questions, concerns.  Pt is able to urinate on own, eat without nausea and vomiting and is able to take po pain medicine.  No concerns for going home.

## 2014-03-10 NOTE — Discharge Summary (Signed)
   Patient ID: Noah Harris [id number] 37 y.o. 13-Jun-1976  03/09/2014  Discharge date and time: 03/10/2014   Admitting Physician: Barry Dienes  Discharge Physician: Edward Jolly  Admission Diagnoses: Acute appendicitis [540.9]  Discharge Diagnoses: same  Operations: Procedure(s): APPENDECTOMY LAPAROSCOPIC  Admission Condition: fair  Discharged Condition: good  Indication for Admission: 38 year old male admitted with symptoms consistent with acute appendicitis which was confirmed by CT scan.  Hospital Course: patient received preoperative IV antibiotics and underwent laparoscopic appendectomy. There was significant appendicitis with some minor fecal spillage at the time of surgery. On the first postoperative morning he is feeling well. Has just some expected soreness. Ambulatory and tolerating diet. Abdomen is benign and he is afebrile. He is felt ready for discharge. Plan followup by mouth Cipro and Flagyl for 1 week.   Disposition: Home  Patient Instructions:    Medication List         ciprofloxacin 500 MG tablet  Commonly known as:  CIPRO  Take 1 tablet (500 mg total) by mouth 2 (two) times daily.     fenofibrate 48 MG tablet  Commonly known as:  TRICOR  Take 48 mg by mouth daily.     metroNIDAZOLE 500 MG tablet  Commonly known as:  FLAGYL  Take 1 tablet (500 mg total) by mouth 3 (three) times daily.     naproxen sodium 220 MG tablet  Commonly known as:  ANAPROX  Take 220-440 mg by mouth 2 (two) times daily as needed. For pain     oxyCODONE-acetaminophen 5-325 MG per tablet  Commonly known as:  PERCOCET/ROXICET  Take 1-2 tablets by mouth every 4 (four) hours as needed for moderate pain.        Activity: activity as tolerated Diet: regular diet Wound Care: none needed  Follow-up:  With Dr. Barry Dienes in 10 days.  Signed: Edward Jolly MD, FACS  03/10/2014, 10:10 AM

## 2014-03-10 NOTE — Progress Notes (Signed)
Patient ID: Noah Harris, male   DOB: Mar 03, 1976, 38 y.o.   MRN: [id number] 1 Day Post-Op  Subjective: Just sore. No significant pain. No nausea. Tolerating diet. Up and about.  Objective: Vital signs in last 24 hours: Temp:  [97.8 F (36.6 C)-99.4 F (37.4 C)] 98.7 F (37.1 C) (06/06 4268) Pulse Rate:  [86-109] 97 (06/06 0632) Resp:  [13-20] 17 (06/06 3419) BP: (114-157)/(72-101) 114/72 mmHg (06/06 0632) SpO2:  [94 %-100 %] 95 % (06/06 6222) Weight:  [216 lb (97.977 kg)] 216 lb (97.977 kg) (06/05 1545) Last BM Date: 03/09/14  Intake/Output from previous day: 06/05 0701 - 06/06 0700 In: 2055 [P.O.:120; I.V.:1835; IV Piggyback:100] Out: 110 [Urine:110] Intake/Output this shift:    General appearance: alert, cooperative and no distress GI: minimal right lower quadrant tenderness without guarding. Incision/Wound: no erythema or drainage  Lab Results:   Recent Labs  03/09/14 1554 03/09/14 1828 03/10/14 0500  WBC 9.3  --  10.7*  HGB 13.5 13.6 12.7*  HCT 38.7* 40.0 37.1*  PLT 219  --  222   BMET  Recent Labs  03/09/14 1828 03/10/14 0500  NA 140 138  K 3.8 4.3  CL 101 100  CO2  --  22  GLUCOSE 92 140*  BUN 7 9  CREATININE 1.00 0.95  CALCIUM  --  8.8     Studies/Results: Ct Abdomen Pelvis W Contrast  03/09/2014   CLINICAL DATA:  Abdominal pain  EXAM: CT ABDOMEN AND PELVIS WITH CONTRAST  TECHNIQUE: Multidetector CT imaging of the abdomen and pelvis was performed using the standard protocol following bolus administration of intravenous contrast. Oral contrast was also administered.  CONTRAST:  134mL OMNIPAQUE IOHEXOL 300 MG/ML  SOLN  COMPARISON:  January 30, 2012  FINDINGS: Lung bases are clear.  Liver is enlarged, measuring 22.8 cm in length. There is diffuse hepatic steatosis. No focal liver lesions are identified. There is no appreciable biliary duct dilatation. Gallbladder wall is not thickened.  Spleen is prominent, measuring 13.9 by 14.9 by 7.0 cm. No focal  splenic lesions are identified. There is a small splenule along the anterior inferior spleen.  Pancreas and adrenals appear normal. Kidneys bilaterally show no mass or hydronephrosis. There is no renal or ureteral calculus on either side.  In the pelvis, the urinary bladder is midline. There is no pelvic mass or fluid collection.  The appendix is dilated with periappendiceal inflammation. No abscess is seen in this region. There is some thickening of the wall of the cecum consistent with the adjacent appendicitis.  There is no bowel obstruction.  No free air or portal venous air.  There is no ascites, adenopathy, or abscess in the abdomen or pelvis. There is no evidence of abdominal aortic aneurysm. There are no blastic or lytic bone lesions.  IMPRESSION: Evidence of acute appendicitis.  No abscess.  Diffuse hepatic steatosis.  There is hepatomegaly and splenomegaly.  Critical Value/emergent results were called by telephone at the time of interpretation on 03/09/2014 at 3:24 PM to Dr. Lujean Amel , who verbally acknowledged these results.   Electronically Signed   By: Lowella Grip M.D.   On: 03/09/2014 15:24    Anti-infectives: Anti-infectives   Start     Dose/Rate Route Frequency Ordered Stop   03/09/14 2200  metroNIDAZOLE (FLAGYL) IVPB 500 mg     500 mg 100 mL/hr over 60 Minutes Intravenous Every 8 hours 03/09/14 2150 03/10/14 0636   03/09/14 2200  cefTRIAXone (ROCEPHIN) 2 g in dextrose 5 %  50 mL IVPB     2 g 100 mL/hr over 30 Minutes Intravenous  Once 03/09/14 2150 03/09/14 2247   03/09/14 2000  [MAR Hold]  cefTRIAXone (ROCEPHIN) 1 g in dextrose 5 % 50 mL IVPB     (On MAR Hold since 03/09/14 1950)   1 g 100 mL/hr over 30 Minutes Intravenous  Once 03/09/14 1950 03/09/14 2009   03/09/14 2000  [MAR Hold]  metroNIDAZOLE (FLAGYL) IVPB 500 mg     (On MAR Hold since 03/09/14 1950)   500 mg 100 mL/hr over 60 Minutes Intravenous  Once 03/09/14 1950 03/09/14 2013      Assessment/Plan: s/p  Procedure(s): APPENDECTOMY LAPAROSCOPIC Doing well postoperatively without apparent complication. Okay for discharge. Followup office 1-2 weeks. Cipro and Flagyl by mouth for one week.   LOS: 1 day    Edward Jolly 03/10/2014

## 2014-03-13 ENCOUNTER — Encounter (HOSPITAL_COMMUNITY): Payer: Self-pay | Admitting: General Surgery

## 2014-03-26 ENCOUNTER — Encounter (INDEPENDENT_AMBULATORY_CARE_PROVIDER_SITE_OTHER): Payer: BC Managed Care – PPO | Admitting: General Surgery

## 2014-04-03 ENCOUNTER — Ambulatory Visit (INDEPENDENT_AMBULATORY_CARE_PROVIDER_SITE_OTHER): Payer: BC Managed Care – PPO | Admitting: General Surgery

## 2014-04-03 ENCOUNTER — Encounter (INDEPENDENT_AMBULATORY_CARE_PROVIDER_SITE_OTHER): Payer: Self-pay | Admitting: General Surgery

## 2014-04-03 VITALS — BP 150/98 | HR 72 | Temp 98.0°F | Resp 14 | Ht 70.0 in | Wt 215.0 lb

## 2014-04-03 DIAGNOSIS — K3533 Acute appendicitis with perforation and localized peritonitis, with abscess: Secondary | ICD-10-CM

## 2014-04-03 DIAGNOSIS — K353 Acute appendicitis with localized peritonitis, without perforation or gangrene: Secondary | ICD-10-CM

## 2014-04-03 NOTE — Progress Notes (Signed)
Noah Harris Sep 07, 1976 [id number] 04/03/2014   History of Present Illness: Noah Harris is a  38 y.o. male who presents today status post lap appy by Dr. Stark Klein.  Pathology reveals fibrous obliteration of the appendix c/w acute appendicitis.  The patient is tolerating a regular diet, having some intermittent diarrhea, but normal at times, has good pain control.  He  is back to most normal activities.   Physical Exam: Abd: soft, nontender, active bowel sounds, nondistended.  All incisions are well healed.  Impression: 1.  Acute appendicitis, s/p lap appy  Plan: He  is able to return to normal activities. He  may follow up on a prn basis.

## 2014-04-03 NOTE — Patient Instructions (Signed)
Follow up as needed

## 2015-01-09 ENCOUNTER — Other Ambulatory Visit: Payer: Self-pay | Admitting: Gastroenterology

## 2015-04-23 IMAGING — CT CT ABD-PELV W/ CM
2 of 4 series · 17 of 46 positions shown, 19 images · IV contrast (READICAT/WATER & [ID] OMNI 300)
Comparison: January 30, 2012

CLINICAL DATA: Abdominal pain

EXAM:
CT ABDOMEN AND PELVIS WITH CONTRAST
TECHNIQUE: Multidetector CT imaging of the abdomen and pelvis was performed
using the standard protocol following bolus administration of
intravenous contrast. Oral contrast was also administered.
CONTRAST:  100mL OMNIPAQUE IOHEXOL 300 MG/ML  SOLN

[Series 2: abd/pelvis with · axial · 0.81mm/px · z∈[-324,+111]mm · 14 of 95 slices shown, 16 images]
[im 4/95  soft-tissue]
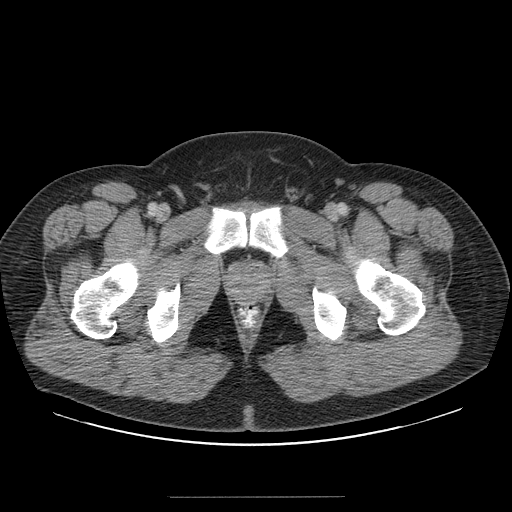
[im 4/95  bone]
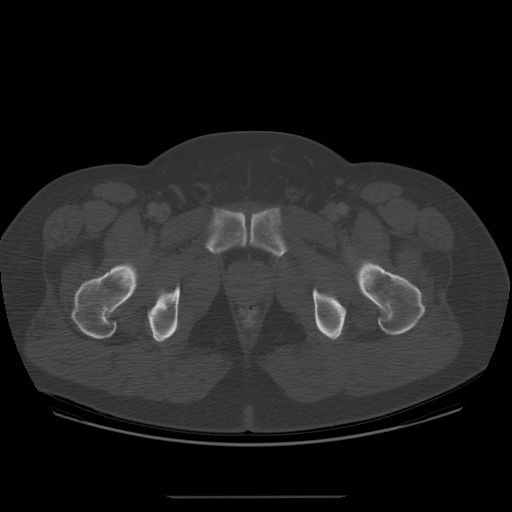
[im 12/95  soft-tissue]
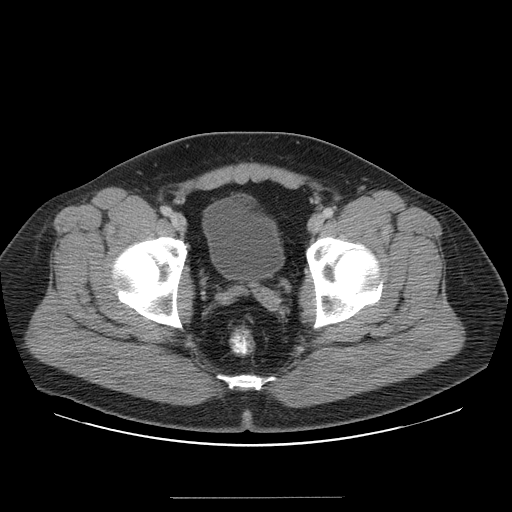
[im 20/95  soft-tissue]
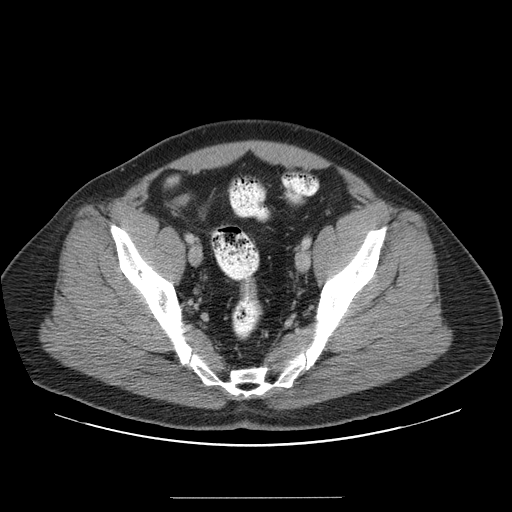
[im 24/95  soft-tissue]
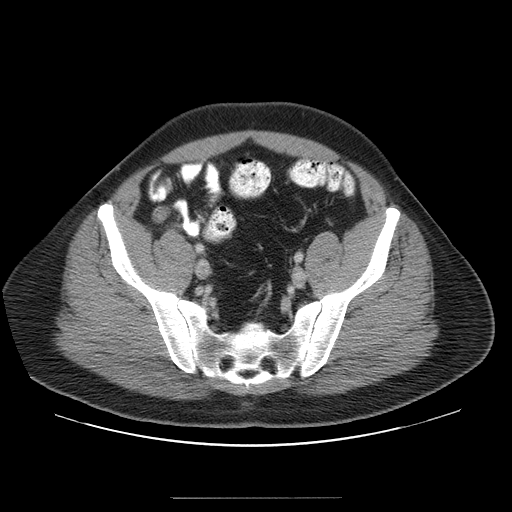
[im 32/95  soft-tissue]
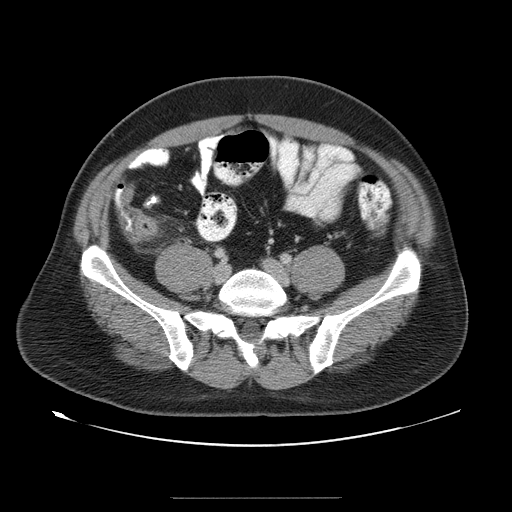
[im 40/95  soft-tissue]
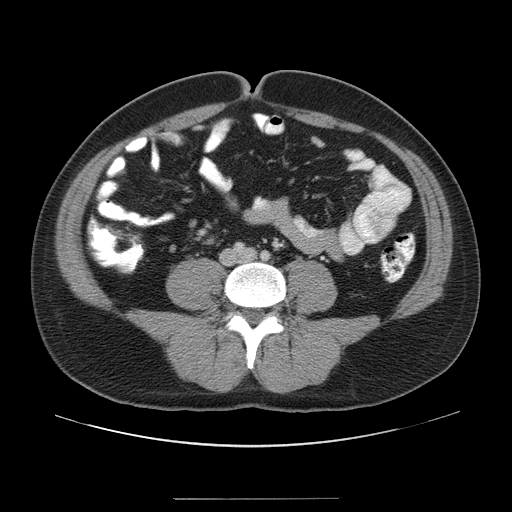
[im 44/95  soft-tissue]
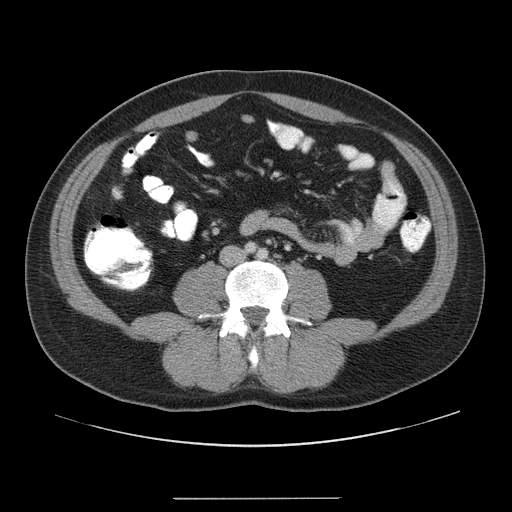
[im 51/95  soft-tissue]
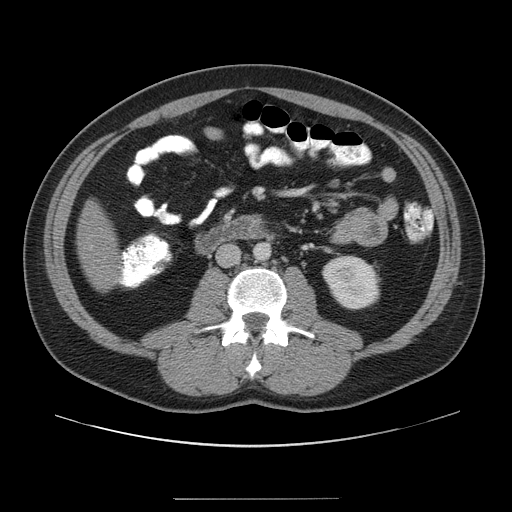
[im 55/95  soft-tissue]
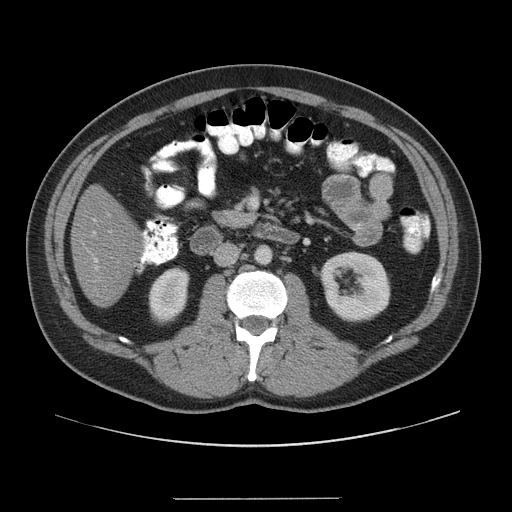
[im 55/95  bone]
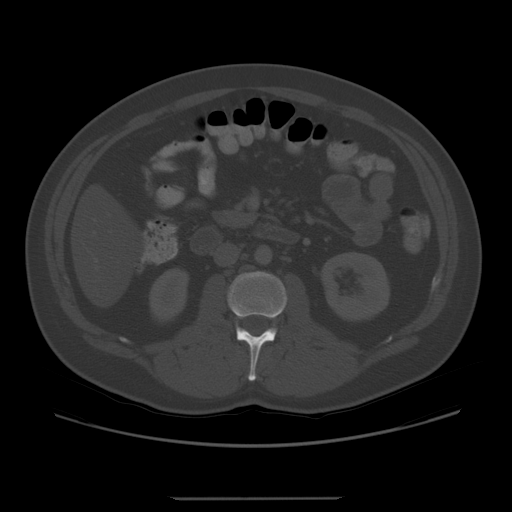
[im 63/95  soft-tissue]
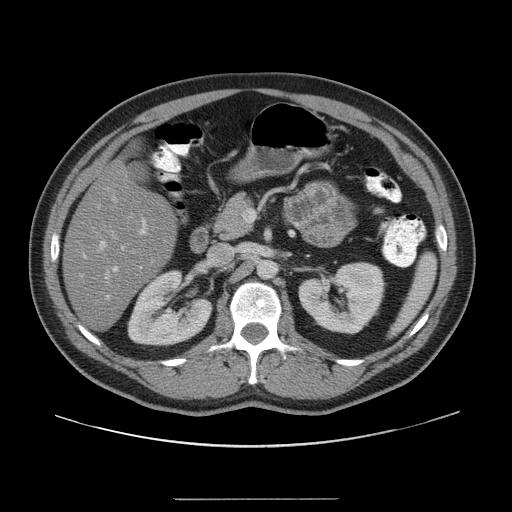
[im 71/95  soft-tissue]
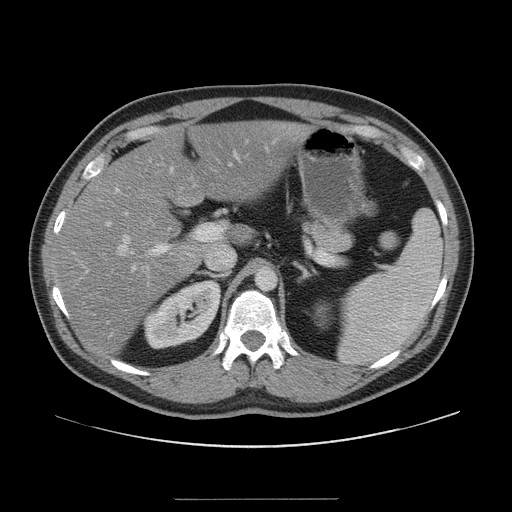
[im 75/95  soft-tissue]
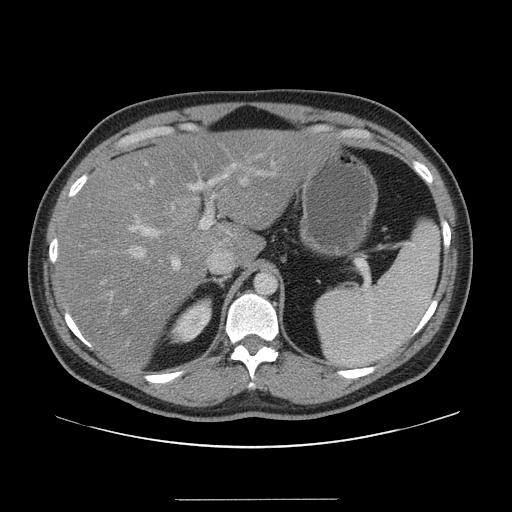
[im 83/95  soft-tissue]
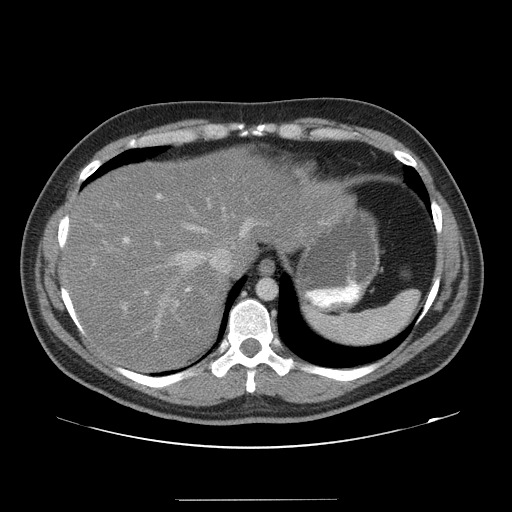
[im 91/95  soft-tissue]
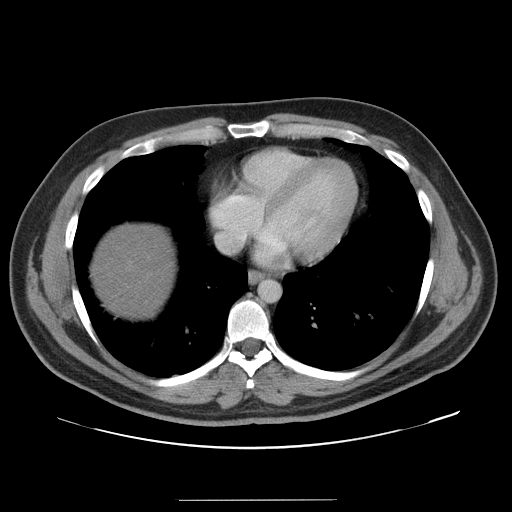

[Series 400: cor · coronal · 1.03mm/px · 3 of 133 slices shown]
[im 45/133  soft-tissue]
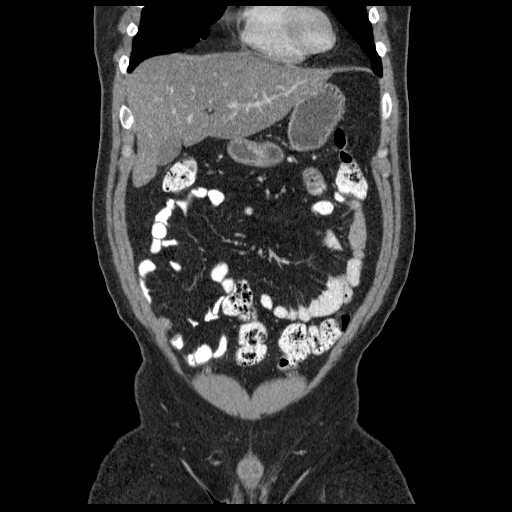
[im 59/133  soft-tissue]
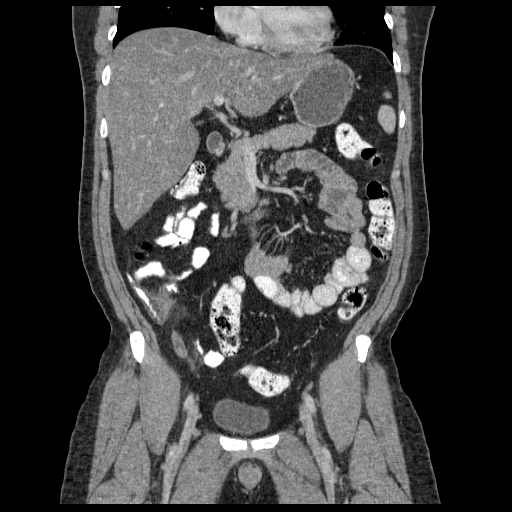
[im 74/133  soft-tissue]
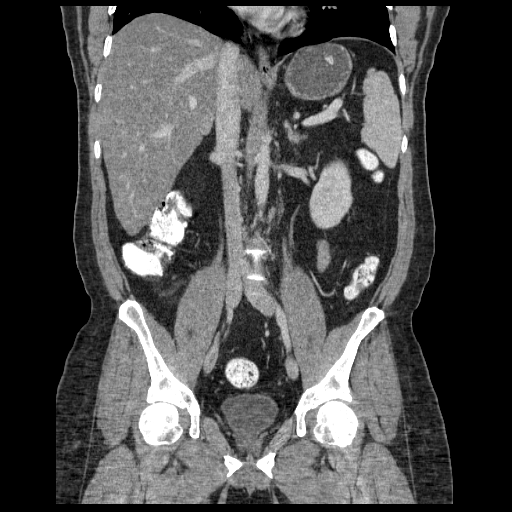

[17 of 46 positions shown; findings below may reference images not displayed]

FINDINGS: Lung bases are clear.

Liver is enlarged, measuring 22.8 cm in length. There is diffuse
hepatic steatosis. No focal liver lesions are identified. There is
no appreciable biliary duct dilatation. Gallbladder wall is not
thickened.

Spleen is prominent, measuring 13.9 by 14.9 by 7.0 cm. No focal
splenic lesions are identified. There is a small splenule along the
anterior inferior spleen.

Pancreas and adrenals appear normal. Kidneys bilaterally show no
mass or hydronephrosis. There is no renal or ureteral calculus on
either side.

In the pelvis, the urinary bladder is midline. There is no pelvic
mass or fluid collection.

The appendix is dilated with periappendiceal inflammation. No
abscess is seen in this region. There is some thickening of the wall
of the cecum consistent with the adjacent appendicitis.

There is no bowel obstruction.  No free air or portal venous air.

There is no ascites, adenopathy, or abscess in the abdomen or
pelvis. There is no evidence of abdominal aortic aneurysm. There are
no blastic or lytic bone lesions.
IMPRESSION: Evidence of acute appendicitis.  No abscess.

Diffuse hepatic steatosis.  There is hepatomegaly and splenomegaly.

Critical Value/emergent results were called by telephone at the time
of interpretation on 03/09/2014 at [DATE] to Dr. BASTIAN ANTONIO HAUMADA , who
verbally acknowledged these results.

## 2018-01-13 ENCOUNTER — Ambulatory Visit (INDEPENDENT_AMBULATORY_CARE_PROVIDER_SITE_OTHER): Payer: Commercial Managed Care - PPO | Admitting: Podiatry

## 2018-01-13 ENCOUNTER — Encounter: Payer: Self-pay | Admitting: Podiatry

## 2018-01-13 VITALS — BP 140/85 | HR 66 | Resp 16

## 2018-01-13 DIAGNOSIS — L600 Ingrowing nail: Secondary | ICD-10-CM | POA: Diagnosis not present

## 2018-01-13 MED ORDER — NEOMYCIN-POLYMYXIN-HC 1 % OT SOLN: OTIC | 1 refills | Status: DC

## 2018-01-13 NOTE — Patient Instructions (Signed)

## 2018-01-13 NOTE — Progress Notes (Signed)
  Subjective:  Patient ID: Noah Harris, male    DOB: Feb 20, 1976,  MRN: [id number] HPI Chief Complaint  Patient presents with  . Toe Pain    Hallux left - lateral border, tender x few weeks, tried trimming some, Dr. Blenda Mounts fixed other borders on big toes    42 y.o. male presents with the above complaint.   ROS: Denies fever chills nausea vomiting muscle aches pains calf pain chest pain shortness of breath.  Past Medical History:  Diagnosis Date  . Hypertension   . Kidney stones    Past Surgical History:  Procedure Laterality Date  . LAPAROSCOPIC APPENDECTOMY N/A 03/09/2014   Procedure: APPENDECTOMY LAPAROSCOPIC;  Surgeon: Stark Klein, MD;  Location: [facility] OR;  Service: General;  Laterality: N/A;    Current Outpatient Medications:  .  fenofibrate (TRICOR) 145 MG tablet, , Disp: , Rfl:  .  hydrochlorothiazide (HYDRODIURIL) 12.5 MG tablet, , Disp: , Rfl:  .  NEOMYCIN-POLYMYXIN-HYDROCORTISONE (CORTISPORIN) 1 % SOLN OTIC solution, Apply 1-2 drops to toe BID after soaking, Disp: 10 mL, Rfl: 1  No Known Allergies Review of Systems Objective:   Vitals:   01/13/18 0830  BP: 140/85  Pulse: 66  Resp: 16    General: Well developed, nourished, in no acute distress, alert and oriented x3   Dermatological: Skin is warm, dry and supple bilateral. Nails x 10 are well maintained; remaining integument appears unremarkable at this time. There are no open sores, no preulcerative lesions, no rash or signs of infection present.  Vascular: Dorsalis Pedis artery and Posterior Tibial artery pedal pulses are 2/4 bilateral with immedate capillary fill time. Pedal hair growth present. No varicosities and no lower extremity edema present bilateral.   Neruologic: Grossly intact via light touch bilateral. Vibratory intact via tuning fork bilateral. Protective threshold with Semmes Wienstein monofilament intact to all pedal sites bilateral. Patellar and Achilles deep tendon reflexes 2+ bilateral. No  Babinski or clonus noted bilateral.   Musculoskeletal: No gross boney pedal deformities bilateral. No pain, crepitus, or limitation noted with foot and ankle range of motion bilateral. Muscular strength 5/5 in all groups tested bilateral.  Gait: Unassisted, Nonantalgic.    Radiographs:  None taken.  Assessment & Plan:   Assessment: Ingrown nail fibular border hallux left.  Plan: After 3 cc of a 50-50 mixture of Marcaine plain lidocaine plain was injected about the hallux left he was prepped and draped as normal sterile fashion.  The lateral border was split from distal proximal exposing the matrix which was utilized 3 applications of phenol 30 seconds each neutralized with isopropyl alcohol.  Silvadene cream Telfa pad and dressed a compressive dressing was applied patient was given both oral and written home-going instructions for the care and soaking of his toe as well as a prescription for Cortisporin Otic to be applied twice daily we will follow-up with him in 2 weeks for reevaluation consider orthotics consider x-rays for plantar fasciitis as well.     T. [place], [place]

## 2018-02-08 ENCOUNTER — Encounter: Payer: Self-pay | Admitting: Podiatry

## 2018-02-08 ENCOUNTER — Ambulatory Visit (INDEPENDENT_AMBULATORY_CARE_PROVIDER_SITE_OTHER): Payer: Commercial Managed Care - PPO | Admitting: Podiatry

## 2018-02-08 DIAGNOSIS — M722 Plantar fascial fibromatosis: Secondary | ICD-10-CM | POA: Diagnosis not present

## 2018-02-08 DIAGNOSIS — L600 Ingrowing nail: Secondary | ICD-10-CM

## 2018-02-08 DIAGNOSIS — Z9889 Other specified postprocedural states: Secondary | ICD-10-CM

## 2018-02-08 MED ORDER — METHYLPREDNISOLONE 4 MG PO TBPK: ORAL_TABLET | ORAL | 0 refills | Status: DC

## 2018-02-08 NOTE — Progress Notes (Signed)
He presents today for follow-up of matrixectomy hallux left.  States he has a soak it for several days now but is doing very well he says it feels much better than it did prior to surgery.  States that there was a scab there which has fallen off as he refers to the fibular border of the hallux left.  He is on to say he is done very well he is happy with it.  But he is having pain to the bilateral heels and he would like to consider orthotics.  Objective: Vital signs are stable he is alert and oriented x3.  Pulses are palpable.  He has pain on palpation medial calcaneal tubercles bilateral.  Hallux left demonstrates no erythema edema saline strange odor matricectomy is gone on to heal uneventfully.  Assessment: Well-healing surgical toe hallux left.  Plantar fasciitis bilateral left greater than right.  Plan: Discussed etiology pathology conservative versus surgical therapies.  At this point in time I recommended he soak in Epsom salts every other day for the next week or 2 to help heal the wound to complete state.  At this point I told him to cover during the day but leave open at bedtime.  He will discontinue the use of the drops.  After sterile Betadine skin prep I injected 20 mg Kenalog 5 mg Marcaine to the medial aspect of bilateral heel plantar fascial calcaneal insertion site.  Tolerated procedure well without complications.  Placement plantar fascial brace bilaterally start him on a Medrol Dosepak.  I did not give him a night splint nor did I give him an NSAID.  I also requested a visit with Liliane Channel for orthotics.

## 2018-02-08 NOTE — Patient Instructions (Addendum)
Epsom Salt Soak Instructions    IF SOAKING IS STILL NECESSARY, START THIS 2 WEEKS AFTER INITIAL PROCEDURE   Place 1/4 cup of epsom salts in a quart of warm tap water.  Soak your foot or feet in the solution for 20 minutes twice a day until you notice the area has dried and a scab has formed. Continue to apply other medications to the area as directed by the doctor such as polysporin, neosporin or cortisporin drops.  IF YOUR SKIN BECOMES IRRITATED WHILE USING THESE INSTRUCTIONS, IT IS OKAY TO SWITCH TO  WHITE VINEGAR AND WATER. Or you may use antibacterial soap and water to keep the toe clean  Monitor for any signs/symptoms of infection. Call the office immediately if any occur or go directly to the emergency room. Call with any questions/concerns.   Plantar Fasciitis (Heel Spur Syndrome) with Rehab The plantar fascia is a fibrous, ligament-like, soft-tissue structure that spans the bottom of the foot. Plantar fasciitis is a condition that causes pain in the foot due to inflammation of the tissue. SYMPTOMS   Pain and tenderness on the underneath side of the foot.  Pain that worsens with standing or walking. CAUSES  Plantar fasciitis is caused by irritation and injury to the plantar fascia on the underneath side of the foot. Common mechanisms of injury include:  Direct trauma to bottom of the foot.  Damage to a small nerve that runs under the foot where the main fascia attaches to the heel bone.  Stress placed on the plantar fascia due to bone spurs. RISK INCREASES WITH:   Activities that place stress on the plantar fascia (running, jumping, pivoting, or cutting).  Poor strength and flexibility.  Improperly fitted shoes.  Tight calf muscles.  Flat feet.  Failure to warm-up properly before activity.  Obesity. PREVENTION  Warm up and stretch properly before activity.  Allow for adequate recovery between workouts.  Maintain physical fitness:  Strength, flexibility, and  endurance.  Cardiovascular fitness.  Maintain a health body weight.  Avoid stress on the plantar fascia.  Wear properly fitted shoes, including arch supports for individuals who have flat feet.  PROGNOSIS  If treated properly, then the symptoms of plantar fasciitis usually resolve without surgery. However, occasionally surgery is necessary.  RELATED COMPLICATIONS   Recurrent symptoms that may result in a chronic condition.  Problems of the lower back that are caused by compensating for the injury, such as limping.  Pain or weakness of the foot during push-off following surgery.  Chronic inflammation, scarring, and partial or complete fascia tear, occurring more often from repeated injections.  TREATMENT  Treatment initially involves the use of ice and medication to help reduce pain and inflammation. The use of strengthening and stretching exercises may help reduce pain with activity, especially stretches of the Achilles tendon. These exercises may be performed at home or with a therapist. Your caregiver may recommend that you use heel cups of arch supports to help reduce stress on the plantar fascia. Occasionally, corticosteroid injections are given to reduce inflammation. If symptoms persist for greater than 6 months despite non-surgical (conservative), then surgery may be recommended.   MEDICATION   If pain medication is necessary, then nonsteroidal anti-inflammatory medications, such as aspirin and ibuprofen, or other minor pain relievers, such as acetaminophen, are often recommended.  Do not take pain medication within 7 days before surgery.  Prescription pain relievers may be given if deemed necessary by your caregiver. Use only as directed and only as much  as you need.  Corticosteroid injections may be given by your caregiver. These injections should be reserved for the most serious cases, because they may only be given a certain number of times.  HEAT AND COLD  Cold  treatment (icing) relieves pain and reduces inflammation. Cold treatment should be applied for 10 to 15 minutes every 2 to 3 hours for inflammation and pain and immediately after any activity that aggravates your symptoms. Use ice packs or massage the area with a piece of ice (ice massage).  Heat treatment may be used prior to performing the stretching and strengthening activities prescribed by your caregiver, physical therapist, or athletic trainer. Use a heat pack or soak the injury in warm water.  SEEK IMMEDIATE MEDICAL CARE IF:  Treatment seems to offer no benefit, or the condition worsens.  Any medications produce adverse side effects.  EXERCISES- RANGE OF MOTION (ROM) AND STRETCHING EXERCISES - Plantar Fasciitis (Heel Spur Syndrome) These exercises may help you when beginning to rehabilitate your injury. Your symptoms may resolve with or without further involvement from your physician, physical therapist or athletic trainer. While completing these exercises, remember:   Restoring tissue flexibility helps normal motion to return to the joints. This allows healthier, less painful movement and activity.  An effective stretch should be held for at least 30 seconds.  A stretch should never be painful. You should only feel a gentle lengthening or release in the stretched tissue.  RANGE OF MOTION - Toe Extension, Flexion  Sit with your right / left leg crossed over your opposite knee.  Grasp your toes and gently pull them back toward the top of your foot. You should feel a stretch on the bottom of your toes and/or foot.  Hold this stretch for 10 seconds.  Now, gently pull your toes toward the bottom of your foot. You should feel a stretch on the top of your toes and or foot.  Hold this stretch for 10 seconds. Repeat  times. Complete this stretch 3 times per day.   RANGE OF MOTION - Ankle Dorsiflexion, Active Assisted  Remove shoes and sit on a chair that is preferably not on a  carpeted surface.  Place right / left foot under knee. Extend your opposite leg for support.  Keeping your heel down, slide your right / left foot back toward the chair until you feel a stretch at your ankle or calf. If you do not feel a stretch, slide your bottom forward to the edge of the chair, while still keeping your heel down.  Hold this stretch for 10 seconds. Repeat 3 times. Complete this stretch 2 times per day.   STRETCH  Gastroc, Standing  Place hands on wall.  Extend right / left leg, keeping the front knee somewhat bent.  Slightly point your toes inward on your back foot.  Keeping your right / left heel on the floor and your knee straight, shift your weight toward the wall, not allowing your back to arch.  You should feel a gentle stretch in the right / left calf. Hold this position for 10 seconds. Repeat 3 times. Complete this stretch 2 times per day.  STRETCH  Soleus, Standing  Place hands on wall.  Extend right / left leg, keeping the other knee somewhat bent.  Slightly point your toes inward on your back foot.  Keep your right / left heel on the floor, bend your back knee, and slightly shift your weight over the back leg so that you feel  a gentle stretch deep in your back calf.  Hold this position for 10 seconds. Repeat 3 times. Complete this stretch 2 times per day.  STRETCH  Gastrocsoleus, Standing  Note: This exercise can place a lot of stress on your foot and ankle. Please complete this exercise only if specifically instructed by your caregiver.   Place the ball of your right / left foot on a step, keeping your other foot firmly on the same step.  Hold on to the wall or a rail for balance.  Slowly lift your other foot, allowing your body weight to press your heel down over the edge of the step.  You should feel a stretch in your right / left calf.  Hold this position for 10 seconds.  Repeat this exercise with a slight bend in your right / left  knee. Repeat 3 times. Complete this stretch 2 times per day.   STRENGTHENING EXERCISES - Plantar Fasciitis (Heel Spur Syndrome)  These exercises may help you when beginning to rehabilitate your injury. They may resolve your symptoms with or without further involvement from your physician, physical therapist or athletic trainer. While completing these exercises, remember:   Muscles can gain both the endurance and the strength needed for everyday activities through controlled exercises.  Complete these exercises as instructed by your physician, physical therapist or athletic trainer. Progress the resistance and repetitions only as guided.  STRENGTH - Towel Curls  Sit in a chair positioned on a non-carpeted surface.  Place your foot on a towel, keeping your heel on the floor.  Pull the towel toward your heel by only curling your toes. Keep your heel on the floor. Repeat 3 times. Complete this exercise 2 times per day.  STRENGTH - Ankle Inversion  Secure one end of a rubber exercise band/tubing to a fixed object (table, pole). Loop the other end around your foot just before your toes.  Place your fists between your knees. This will focus your strengthening at your ankle.  Slowly, pull your big toe up and in, making sure the band/tubing is positioned to resist the entire motion.  Hold this position for 10 seconds.  Have your muscles resist the band/tubing as it slowly pulls your foot back to the starting position. Repeat 3 times. Complete this exercises 2 times per day.  Document Released: 09/21/2005 Document Revised: 12/14/2011 Document Reviewed: 01/03/2009 [facility] Patient Information 2014 [place], [place].

## 2018-03-14 ENCOUNTER — Ambulatory Visit (INDEPENDENT_AMBULATORY_CARE_PROVIDER_SITE_OTHER): Payer: Commercial Managed Care - PPO | Admitting: Orthotics

## 2018-03-14 DIAGNOSIS — M722 Plantar fascial fibromatosis: Secondary | ICD-10-CM | POA: Diagnosis not present

## 2018-03-14 NOTE — Progress Notes (Signed)
Patient came into today for casting bilateral f/o to address plantar fasciitis.  Patient reports history of foot pain involving plantar aponeurosis.  Goal is to provide longitudinal arch support and correct any RF instability due to heel eversion/inversion.  Ultimate goal is to relieve tension at pf insertion calcaneal tuberosity.  Plan on semi-rigid device addressing heel stability and relieving PF tension.     Plan on [employer]  Patient signed Financial Responsibility Form.

## 2018-04-04 ENCOUNTER — Ambulatory Visit: Payer: Commercial Managed Care - PPO | Admitting: Orthotics

## 2018-04-04 DIAGNOSIS — M722 Plantar fascial fibromatosis: Secondary | ICD-10-CM

## 2018-04-15 NOTE — Progress Notes (Signed)
Patient came in today to pick up custom made foot orthotics.  The goals were accomplished and the patient reported no dissatisfaction with said orthotics.  Patient was advised of breakin period and how to report any issues. 

## 2018-05-26 ENCOUNTER — Ambulatory Visit (INDEPENDENT_AMBULATORY_CARE_PROVIDER_SITE_OTHER): Payer: Commercial Managed Care - PPO | Admitting: Podiatry

## 2018-05-26 ENCOUNTER — Encounter: Payer: Self-pay | Admitting: Podiatry

## 2018-05-26 DIAGNOSIS — M722 Plantar fascial fibromatosis: Secondary | ICD-10-CM | POA: Diagnosis not present

## 2018-05-26 NOTE — Progress Notes (Signed)
He presents today for follow-up of his bilateral plantar fasciitis he states that the left one feels like him stepping on a nail right is a very bottom of the heel he says is very sharp and worse in the evenings but I continue to wear the orthotics on a regular basis.  The right was doing fine he says.  Objective: Vital signs are stable he is alert and oriented x3.  Pulses are palpable.  Neurologic sensorium is intact.  Degenerative flexors are intact.  Muscle strength is normal symmetrical.  Pain on palpation medial calcaneal tubercle and plantar central calcaneal tubercle severe.  Assessment: Letter fasciitis left.  Plan: After sterile Betadine skin prep I injected 20 mg Kenalog 5 mg Marcaine point maximal tenderness of the left heel.  Tolerated procedure well without complications follow-up with me as needed.

## 2018-07-07 ENCOUNTER — Ambulatory Visit (INDEPENDENT_AMBULATORY_CARE_PROVIDER_SITE_OTHER): Payer: Commercial Managed Care - PPO | Admitting: Podiatry

## 2018-07-07 ENCOUNTER — Encounter: Payer: Self-pay | Admitting: Podiatry

## 2018-07-07 DIAGNOSIS — M722 Plantar fascial fibromatosis: Secondary | ICD-10-CM

## 2018-07-07 NOTE — Progress Notes (Signed)
He presents today for follow-up of his left heel states that is feeling pretty good as some soreness with increased activity.  He states that I was thinking about cutting out this appointment but I wanted to make sure that everything is okay.  Objective: Vital signs are stable he is alert and oriented x3 he has minimal pain on palpation medial calcaneal tubercle of the left heel.  Pulses remain palpable no open lesions or wounds.  Assessment: Plantar fasciitis resolved.  Plan: Continue all conservative therapies including orthotics nonsteroidals if necessary follow-up with me as needed

## 2020-01-16 ENCOUNTER — Encounter: Payer: Self-pay | Admitting: Podiatry

## 2020-01-16 ENCOUNTER — Ambulatory Visit (INDEPENDENT_AMBULATORY_CARE_PROVIDER_SITE_OTHER): Payer: Commercial Managed Care - PPO | Admitting: Podiatry

## 2020-01-16 ENCOUNTER — Other Ambulatory Visit: Payer: Self-pay

## 2020-01-16 DIAGNOSIS — M722 Plantar fascial fibromatosis: Secondary | ICD-10-CM | POA: Diagnosis not present

## 2020-01-16 MED ORDER — METHYLPREDNISOLONE 4 MG PO TBPK: ORAL_TABLET | ORAL | 0 refills | Status: DC

## 2020-01-16 MED ORDER — MELOXICAM 15 MG PO TABS: 15.0000 mg | ORAL_TABLET | Freq: Every day | ORAL | 3 refills | Status: DC

## 2020-01-16 NOTE — Patient Instructions (Signed)

## 2020-01-16 NOTE — Progress Notes (Signed)
He presents today after having not seen him for couple of years states that his right heel has started bothering him again.  States that him having sharp pain in the heel again I did not start until I got some new shoes back in January but also had my mother moved in from [place] she was with [place] for quite some time and then we have moved her into a condo about 1 mile down the road and I have been walking back and forth which may have contributed to some degree.  ROS: Denies fever chills nausea vomiting muscle aches pains calf pain back pain chest pain shortness of breath.  Objective: Vital signs are stable alert and oriented x3 there is no erythema edema cellulitis drainage or odor.  Pulses are palpable.  Neurologic sensorium is intact.  Deep tendon reflexes are intact muscle strength is normal symmetrical.  He has pain on palpation medial calcaneal tubercle of the right heel only no other pains anywhere else on the foot.  Radiographs not taken.  Assessment: Recurrence of plantar fasciitis right heel.  Plan: Discussed etiology pathology conservative versus surgical therapies at this point I reinjected his right heel today with 20 mg Kenalog 5 mg Marcaine to the point of maximal tenderness of the plantar fifth calcaneal insertion site.  Tolerated procedure well without complications wrote a prescription for methylprednisolone to be followed by meloxicam.  Discussed appropriate shoe gear stretching exercises ice therapy and shoe gear modifications we will follow-up with him in 1 month if necessary.

## 2020-02-15 ENCOUNTER — Ambulatory Visit: Payer: Commercial Managed Care - PPO | Admitting: Podiatry

## 2021-07-02 ENCOUNTER — Encounter: Payer: Self-pay | Admitting: Dermatology

## 2021-07-02 ENCOUNTER — Other Ambulatory Visit: Payer: Self-pay

## 2021-07-02 ENCOUNTER — Ambulatory Visit (INDEPENDENT_AMBULATORY_CARE_PROVIDER_SITE_OTHER): Payer: Commercial Managed Care - PPO | Admitting: Dermatology

## 2021-07-02 DIAGNOSIS — Q825 Congenital non-neoplastic nevus: Secondary | ICD-10-CM

## 2021-07-02 DIAGNOSIS — D224 Melanocytic nevi of scalp and neck: Secondary | ICD-10-CM

## 2021-07-02 DIAGNOSIS — Z1283 Encounter for screening for malignant neoplasm of skin: Secondary | ICD-10-CM

## 2021-07-02 DIAGNOSIS — D229 Melanocytic nevi, unspecified: Secondary | ICD-10-CM

## 2021-07-18 ENCOUNTER — Encounter: Payer: Self-pay | Admitting: Dermatology

## 2021-07-18 NOTE — Progress Notes (Signed)
   New Patient   Subjective  Noah Harris is a 45 y.o. male who presents for the following: Annual Exam (Yearly skin check left outer eye was rough to touch now smooth. No personal history of skin cancer or atypia ).  General skin examination, 1 rough spot outside left eye self resolved. Location:  Duration:  Quality:  Associated Signs/Symptoms: Modifying Factors:  Severity:  Timing: Context:    The following portions of the chart were reviewed this encounter and updated as appropriate:  Tobacco  Allergies  Meds  Problems  Med Hx  Surg Hx  Fam Hx      Objective  Well appearing patient in no apparent distress; mood and affect are within normal limits. Mid Back Full body skin exam.  No atypical pigmented lesions or nonmelanoma skin cancer.  Mid Frontal Scalp Historically stable 1.2 sonometer monochrome brown macule  Mid Occipital Scalp 3 mm monochrome gray macule; dermoscopy amorphous         A full examination was performed including scalp, head, eyes, ears, nose, lips, neck, chest, axillae, abdomen, back, buttocks, bilateral upper extremities, bilateral lower extremities, hands, feet, fingers, toes, fingernails, and toenails. All findings within normal limits unless otherwise noted below.   Assessment & Plan  Screening for malignant neoplasm of skin Mid Back  Yearly skin exams.  Encouraged to self examine twice annually.  Continued ultraviolet protection.  Birth mark Mid Frontal Scalp  Leave if stable  Nevus Mid Occipital Scalp  Photo taken. No biopsy per ST (no history available).

## 2022-07-07 ENCOUNTER — Ambulatory Visit: Payer: Commercial Managed Care - PPO | Admitting: Dermatology

## 2022-09-17 ENCOUNTER — Encounter: Payer: Self-pay | Admitting: Podiatry

## 2022-09-17 ENCOUNTER — Ambulatory Visit (INDEPENDENT_AMBULATORY_CARE_PROVIDER_SITE_OTHER): Payer: Commercial Managed Care - PPO | Admitting: Podiatry

## 2022-09-17 DIAGNOSIS — M722 Plantar fascial fibromatosis: Secondary | ICD-10-CM

## 2022-09-20 NOTE — Progress Notes (Signed)
He was scanned today for a new pair of orthotics for history of Planter fasciitis.  He would like to maintain treatment plan follow-up with [place] as needed.

## 2022-10-15 ENCOUNTER — Ambulatory Visit (INDEPENDENT_AMBULATORY_CARE_PROVIDER_SITE_OTHER): Payer: Commercial Managed Care - PPO

## 2022-10-15 DIAGNOSIS — M722 Plantar fascial fibromatosis: Secondary | ICD-10-CM

## 2022-10-15 NOTE — Progress Notes (Signed)
Patient presents today to be casted for custom molded orthotics. Dr. Milinda Pointer has been treating patient for plantar fasciitis.   Impression foam cast was taken. ABN signed.  Patient info-  Shoe size: 10.5 men's  Shoe style: athletic shoe  Weight: 121 lbs  Insurance: [employer]   Patient will be notified once orthotics arrive in office and reappoint for fitting at that time.

## 2022-10-29 ENCOUNTER — Other Ambulatory Visit: Payer: Commercial Managed Care - PPO

## 2022-11-09 ENCOUNTER — Other Ambulatory Visit: Payer: Commercial Managed Care - PPO

## 2022-11-11 ENCOUNTER — Other Ambulatory Visit: Payer: Commercial Managed Care - PPO

## 2022-11-27 ENCOUNTER — Ambulatory Visit (INDEPENDENT_AMBULATORY_CARE_PROVIDER_SITE_OTHER): Payer: Commercial Managed Care - PPO

## 2022-11-27 DIAGNOSIS — M722 Plantar fascial fibromatosis: Secondary | ICD-10-CM

## 2022-11-27 NOTE — Progress Notes (Signed)
Patient presents today to pick up custom molded foot orthotics recommended by Dr. Milinda Pointer.   Orthotics were dispensed and fit was satisfactory. Reviewed instructions for break-in and wear. Written instructions given to patient.  Patient will follow up as needed.   [facility] - order # [id number]

## 2022-12-15 ENCOUNTER — Telehealth: Payer: Self-pay | Admitting: Podiatry

## 2022-12-15 NOTE — Telephone Encounter (Signed)
Called the patient this morning to reschedule him from 12/18/22, patient is very upset this has been a 6 month process to get orthotics.  His orthotics have been sent back 2 times to the labs and they are still not correct, he can't use them.  He was to come in to be scanned with [facility] on Friday, to try to get him orthotics he can use.  Patient no longer wants to continue with this process, he wants the charges taken off of his account he will return the orthotics he was given and he will figure out something else.

## 2022-12-18 ENCOUNTER — Other Ambulatory Visit: Payer: Commercial Managed Care - PPO

## 2022-12-22 ENCOUNTER — Ambulatory Visit: Payer: Commercial Managed Care - PPO | Admitting: Podiatry

## 2022-12-24 ENCOUNTER — Ambulatory Visit: Payer: Commercial Managed Care - PPO | Admitting: Podiatry

## 2023-04-09 ENCOUNTER — Other Ambulatory Visit: Payer: Self-pay | Admitting: Family Medicine

## 2023-04-09 DIAGNOSIS — N23 Unspecified renal colic: Secondary | ICD-10-CM

## 2023-04-16 ENCOUNTER — Ambulatory Visit: Admission: RE | Admit: 2023-04-16 | Payer: Commercial Managed Care - PPO | Source: Ambulatory Visit

## 2023-04-16 DIAGNOSIS — N23 Unspecified renal colic: Secondary | ICD-10-CM

## 2023-04-22 ENCOUNTER — Other Ambulatory Visit: Payer: Self-pay | Admitting: Urology

## 2023-04-23 ENCOUNTER — Other Ambulatory Visit (HOSPITAL_COMMUNITY): Payer: Self-pay | Admitting: Urology

## 2023-04-23 DIAGNOSIS — N200 Calculus of kidney: Secondary | ICD-10-CM

## 2023-05-19 NOTE — Patient Instructions (Addendum)
SURGICAL WAITING ROOM VISITATION  Patients having surgery or a procedure may have no more than 2 support people in the waiting area - these visitors may rotate.    Children under the age of 79 must have an adult with them who is not the patient.  Due to an increase in RSV and influenza rates and associated hospitalizations, children ages 9 and under may not visit patients in [facility] hospitals.  If the patient needs to stay at the hospital during part of their recovery, the visitor guidelines for inpatient rooms apply. Pre-op nurse will coordinate an appropriate time for 1 support person to accompany patient in pre-op.  This support person may not rotate.    Please refer to the [facility] website for the visitor guidelines for Inpatients (after your surgery is over and you are in a regular room).       Your procedure is scheduled on: 06/04/23   Report to [facility] Main Entrance    Report to admitting at 7:30 AM   Call this number if you have problems the morning of surgery [phone]   Do not eat food or drink liquids :After Midnight.   Oral Hygiene is also important to reduce your risk of infection.                                    Remember - BRUSH YOUR TEETH THE MORNING OF SURGERY WITH YOUR REGULAR TOOTHPASTE   Do NOT smoke after Midnight   Stop all vitamins and herbal supplements 7 days before surgery.   Take these medicines the morning of surgery with A SIP OF WATER: Fenofibrate(Tricor)             You may not have any metal on your body including hair pins, jewelry, and body piercing             Do not wear lotions, powders, cologne, or deodorant  .              Men may shave face and neck.   Do not bring valuables to the hospital. [facility] IS NOT             RESPONSIBLE   FOR VALUABLES.   Contacts, glasses, dentures or bridgework may not be worn into surgery.   Bring small overnight bag day of surgery.   DO NOT BRING YOUR HOME MEDICATIONS TO  THE HOSPITAL. PHARMACY WILL DISPENSE MEDICATIONS LISTED ON YOUR MEDICATION LIST TO YOU DURING YOUR ADMISSION IN THE HOSPITAL!    Patients discharged on the day of surgery will not be allowed to drive home.  Someone NEEDS to stay with you for the first 24 hours after anesthesia.   Special Instructions: Bring a copy of your healthcare power of attorney and living will documents the day of surgery if you haven't scanned them before.              Please read over the following fact sheets you were given: IF YOU HAVE QUESTIONS ABOUT YOUR PRE-OP INSTRUCTIONS PLEASE CALL [phone] [address]   If you received a COVID test during your pre-op visit  it is requested that you wear a mask when out in public, stay away from anyone that may not be feeling well and notify your surgeon if you develop symptoms. If you test positive for Covid or have been in contact with anyone that has tested positive in  the last 10 days please notify you surgeon.    [facility] - Preparing for Surgery Before surgery, you can play an important role.  Because skin is not sterile, your skin needs to be as free of germs as possible.  You can reduce the number of germs on your skin by washing with CHG (chlorahexidine gluconate) soap before surgery.  CHG is an antiseptic cleaner which kills germs and bonds with the skin to continue killing germs even after washing. Please DO NOT use if you have an allergy to CHG or antibacterial soaps.  If your skin becomes reddened/irritated stop using the CHG and inform your nurse when you arrive at Short Stay. Do not shave (including legs and underarms) for at least 48 hours prior to the first CHG shower.  You may shave your face/neck.  Please follow these instructions carefully:  1.  Shower with CHG Soap the night before surgery and the  morning of surgery.  2.  If you choose to wash your hair, wash your hair first as usual with your normal  shampoo.  3.  After you shampoo, rinse your hair and body  thoroughly to remove the shampoo.                             4.  Use CHG as you would any other liquid soap.  You can apply chg directly to the skin and wash.  Gently with a scrungie or clean washcloth.  5.  Apply the CHG Soap to your body ONLY FROM THE NECK DOWN.   Do   not use on face/ open                           Wound or open sores. Avoid contact with eyes, ears mouth and   genitals (private parts).                       Wash face,  Genitals (private parts) with your normal soap.             6.  Wash thoroughly, paying special attention to the area where your    surgery  will be performed.  7.  Thoroughly rinse your body with warm water from the neck down.  8.  DO NOT shower/wash with your normal soap after using and rinsing off the CHG Soap.                9.  Pat yourself dry with a clean towel.            10.  Wear clean pajamas.            11.  Place clean sheets on your bed the night of your first shower and do not  sleep with pets. Day of Surgery : Do not apply any lotions/deodorants the morning of surgery.  Please wear clean clothes to the hospital/surgery center.  FAILURE TO FOLLOW THESE INSTRUCTIONS MAY RESULT IN THE CANCELLATION OF YOUR SURGERY  PATIENT SIGNATURE_________________________________  NURSE SIGNATURE__________________________________  ________________________________________________________________________

## 2023-05-19 NOTE — Progress Notes (Signed)
COVID Vaccine received:  []  No [x]  Yes Date of any COVID positive Test in last 90 days:  PCP - Dibas Koirala MD Cardiologist -   Chest x-ray -  EKG -   Stress Test -  ECHO -  Cardiac Cath -   Bowel Prep - []  No  []   Yes ______  Pacemaker / ICD device []  No []  Yes   Spinal Cord Stimulator:[]  No []  Yes       History of Sleep Apnea? []  No []  Yes   CPAP used?- []  No []  Yes    Does the patient monitor blood sugar?          []  No []  Yes  []  N/A  Patient has: []  NO Hx DM   []  Pre-DM                 []  DM1  []   DM2 Does patient have a [employer] or Dex[employer]om? []  No []  Yes   Fasting Blood Sugar Ranges-  Checks Blood Sugar _____ times a day  GLP1 agonist / usual dose -  GLP1 instructions:  SGLT-2 inhibitors / usual dose -  SGLT-2 instructions:   Blood Thinner / Instructions: Aspirin Instructions:  Comments:   Activity level: Patient is able / unable to climb a flight of stairs without difficulty; []  No CP  []  No SOB, but would have ___   Patient can / can not perform ADLs without assistance.   Anesthesia review:   Patient denies shortness of breath, fever, cough and chest pain at PAT appointment.  Patient verbalized understanding and agreement to the Pre-Surgical Instructions that were given to them at this PAT appointment. Patient was also educated of the need to review these PAT instructions again prior to his/her surgery.I reviewed the appropriate phone numbers to call if they have any and questions or concerns.

## 2023-05-24 ENCOUNTER — Encounter (HOSPITAL_COMMUNITY): Payer: Self-pay

## 2023-05-24 ENCOUNTER — Other Ambulatory Visit: Payer: Self-pay

## 2023-05-24 ENCOUNTER — Encounter (HOSPITAL_COMMUNITY)
Admission: RE | Admit: 2023-05-24 | Discharge: 2023-05-24 | Disposition: A | Discharge status: Home / Self Care | Payer: Commercial Managed Care - PPO | Source: Ambulatory Visit | Attending: Urology | Admitting: Urology

## 2023-05-24 VITALS — BP 133/88 | HR 60 | Temp 98.5°F | Resp 16 | Ht 70.0 in | Wt 217.6 lb

## 2023-05-24 DIAGNOSIS — I10 Essential (primary) hypertension: Secondary | ICD-10-CM | POA: Insufficient documentation

## 2023-05-24 DIAGNOSIS — Z01812 Encounter for preprocedural laboratory examination: Secondary | ICD-10-CM | POA: Diagnosis not present

## 2023-05-24 DIAGNOSIS — Z01818 Encounter for other preprocedural examination: Secondary | ICD-10-CM

## 2023-05-24 DIAGNOSIS — Z01810 Encounter for preprocedural cardiovascular examination: Secondary | ICD-10-CM | POA: Insufficient documentation

## 2023-05-24 HISTORY — DX: Sleep apnea, unspecified: G47.30

## 2023-05-24 HISTORY — DX: Prediabetes: R73.03

## 2023-05-24 HISTORY — DX: Anemia, unspecified: D64.9

## 2023-05-24 LAB — BASIC METABOLIC PANEL
Anion gap: 9 (ref 5–15)
BUN: 20 mg/dL (ref 6–20)
CO2: 25 mmol/L (ref 22–32)
Calcium: 9.5 mg/dL (ref 8.9–10.3)
Chloride: 106 mmol/L (ref 98–111)
Creatinine, Ser: 1.07 mg/dL (ref 0.61–1.24)
GFR, Estimated: >60 mL/min (ref >60)
Glucose, Bld: 114 mg/dL — ABNORMAL HIGH (ref 70–99)
Potassium: 3.5 mmol/L (ref 3.5–5.1)
Sodium: 140 mmol/L (ref 135–145)

## 2023-05-24 LAB — CBC
HCT: 37.4 % — ABNORMAL LOW (ref 39.0–52.0)
Hemoglobin: 12.1 g/dL — ABNORMAL LOW (ref 13.0–17.0)
MCH: 27.4 pg (ref 26.0–34.0)
MCHC: 32.4 g/dL (ref 30.0–36.0)
MCV: 84.6 fL (ref 80.0–100.0)
Platelets: 219 10*3/uL (ref 150–400)
RBC: 4.42 MIL/uL (ref 4.22–5.81)
RDW: 12.7 % (ref 11.5–15.5)
WBC: 4.9 10*3/uL (ref 4.0–10.5)
nRBC: 0 % (ref 0.0–0.2)

## 2023-05-24 LAB — TYPE AND SCREEN
ABO/RH(D): O POS
Antibody Screen: NEGATIVE

## 2023-05-24 NOTE — Progress Notes (Signed)
COVID Vaccine received:  []  No [x]  Yes Date of any COVID positive Test in last 90 days: N/A   PCP - Dibas Koirala MD Cardiologist - N/A   Chest x-ray - N/A EKG -  05/24/23 in [facility] Stress Test - N/A ECHO - N/A Cardiac Cath - N/A   Bowel Prep - [x]  No  []   Yes ______   Pacemaker / ICD device [x]  No []  Yes   Spinal Cord Stimulator:[x]  No []  Yes       History of Sleep Apnea? []  No [x]  Yes   CPAP used?- []  No [x]  Yes     Does the patient monitor blood sugar?          [x]  No []  Yes  []  N/A   Patient has: []  NO Hx DM   [x]  Pre-DM                 []  DM1  []   DM2 Does patient have a [employer] or Dexacom? [x]  No []  Yes   Fasting Blood Sugar Ranges- N/A Checks Blood Sugar __N/A___ times a day   GLP1 agonist / usual dose - N/A GLP1 instructions: N/A SGLT-2 inhibitors / usual dose - N/A SGLT-2 instructions: N/A   Blood Thinner / Instructions: N/A Aspirin Instructions: N/A   Comments:    Activity level: Patient is able to climb a flight of stairs without difficulty; [x]  No CP  [x]  No SOB   Anesthesia review: N/A   Patient denies shortness of breath, fever, cough and chest pain at PAT appointment.   Patient verbalized understanding and agreement to the Pre-Surgical Instructions that were given to them at this PAT appointment. Patient was also educated of the need to review these PAT instructions again prior to his/her surgery.I reviewed the appropriate phone numbers to call if they have any and questions or concerns.

## 2023-06-03 ENCOUNTER — Other Ambulatory Visit (HOSPITAL_COMMUNITY): Payer: Self-pay | Admitting: Student

## 2023-06-03 DIAGNOSIS — N23 Unspecified renal colic: Secondary | ICD-10-CM

## 2023-06-03 NOTE — Anesthesia Preprocedure Evaluation (Addendum)
Anesthesia Evaluation  Patient identified by MRN, date of birth, ID band Patient awake    Reviewed: Allergy & Precautions, NPO status , Patient's Chart, lab work & pertinent test results  History of Anesthesia Complications Negative for: history of anesthetic complications  Airway Mallampati: II  TM Distance: >3 FB Neck ROM: Full    Dental no notable dental hx.    Pulmonary sleep apnea    Pulmonary exam normal        Cardiovascular hypertension, Pt. on medications Normal cardiovascular exam     Neuro/Psych negative neurological ROS     GI/Hepatic negative GI ROS, Neg liver ROS,,,  Endo/Other  negative endocrine ROS    Renal/GU LEFT RENAL STONE     Musculoskeletal negative musculoskeletal ROS (+)    Abdominal   Peds  Hematology  (+) Blood dyscrasia (Hgb 12.1), anemia   Anesthesia Other Findings Day of surgery medications reviewed with patient.  Reproductive/Obstetrics                             Anesthesia Physical Anesthesia Plan  ASA: 2  Anesthesia Plan: General   Post-op Pain Management: Tylenol PO (pre-op)*   Induction: Intravenous  PONV Risk Score and Plan: 3 and Treatment may vary due to age or medical condition, Ondansetron, Dexamethasone and Midazolam  Airway Management Planned: Oral ETT  Additional Equipment: None  Intra-op Plan:   Post-operative Plan: Extubation in OR  Informed Consent: I have reviewed the patients History and Physical, chart, labs and discussed the procedure including the risks, benefits and alternatives for the proposed anesthesia with the patient or authorized representative who has indicated his/her understanding and acceptance.     Dental advisory given  Plan Discussed with: CRNA  Anesthesia Plan Comments:        Anesthesia Quick Evaluation

## 2023-06-03 NOTE — H&P (Signed)
Chief Complaint: Patient was seen in consultation today for L PCN placement  at the request of Bell,Eugene D Harris  Referring Physician(s): Ray Church Harris  Supervising Physician: Gilmer Mor  Patient Status: [facility] - Out-pt  History of Present Illness: Noah Harris is a 47 y.o. male with PMHs of HTN, pre-diabetic and left kidney stone who is scheduled for L PCNL, he presents to [facility] IR for L PCN placement prior to the surgery.   Patient has been having several months of left sided flank pain, CT renal stone study on 04/16/23 showed:  Bilateral nephrolithiasis.   Mild left hydronephrosis with 15 mm calculus in the left renal pelvis. No ureteral calculi identified.   Moderate hepatic steatosis.  Patient has been seen by Dr.Bell from urology and he is scheduled for L PCNL today, IR was asked to place L PCN prior to the surgery.   Patient laying in bed, not in acute distress. Wife at bedside.  Denies left flank pain today.  Denise headache, fever, chills, shortness of breath, cough, chest pain, abdominal pain, nausea ,vomiting, and bleeding. No sx of UTI.    Past Medical History:  Diagnosis Date   Anemia    Hypertension    Kidney stones    Pre-diabetes    Sleep apnea     Past Surgical History:  Procedure Laterality Date   LAPAROSCOPIC APPENDECTOMY N/A 03/09/2014   Procedure: APPENDECTOMY LAPAROSCOPIC;  Surgeon: Almond Lint, MD;  Location: [facility] OR;  Service: General;  Laterality: N/A;    Allergies: Patient has no known allergies.  Medications: Prior to Admission medications   Medication Sig Start Date End Date Taking? Authorizing Provider  Ascorbic Acid (VITAMIN C) 1000 MG tablet Take 1,000 mg by mouth every evening.    [provider]  cholecalciferol (VITAMIN D3) 25 MCG (1000 UNIT) tablet Take 1,000 Units by mouth every evening.    [provider]  fenofibrate (TRICOR) 145 MG tablet Take 145 mg by mouth in the morning. 01/08/18   [provider]  HYDROcodone-acetaminophen (NORCO/VICODIN) 5-325 MG tablet Take 1 tablet by mouth every 6 (six) hours as needed (pain).    [provider]  lisinopril-hydrochlorothiazide (ZESTORETIC) 20-12.5 MG tablet Take 1 tablet by mouth in the morning.    [provider]     Family History  Problem Relation Age of Onset   Cancer Mother        breast   Cancer Father        brain tumor malignant    Social History   Socioeconomic History   Marital status: Married    Spouse name: Not on file   Number of children: Not on file   Years of education: Not on file   Highest education level: Not on file  Occupational History   Not on file  Tobacco Use   Smoking status: Never    Passive exposure: Never   Smokeless tobacco: Never  Vaping Use   Vaping status: Never Used  Substance and Sexual Activity   Alcohol use: Yes    Comment: occ   Drug use: No   Sexual activity: Not on file  Other Topics Concern   Not on file  Social History Narrative   Not on file   Social Determinants of Health   Financial Resource Strain: Not on file  Food Insecurity: Not on file  Transportation Needs: Not on file  Physical Activity: Not on file  Stress: Not on file  Social Connections:  Not on file     Review of Systems: A 12 point ROS discussed and pertinent positives are indicated in the HPI above.  All other systems are negative.  Vital Signs: There were no vitals taken for this visit.   Physical Exam Vitals and nursing note reviewed.  Constitutional:      General: Patient is not in acute distress.    Appearance: Normal appearance. Patient is not ill-appearing.  HENT:     Head: Normocephalic and atraumatic.     Mouth/Throat:     Mouth: Mucous membranes are moist.     Pharynx: Oropharynx is clear.  Cardiovascular:     Rate and Rhythm: Normal rate and regular rhythm.     Pulses: Normal pulses.     Heart sounds: Normal heart sounds.  Pulmonary:     Effort:  Pulmonary effort is normal.     Breath sounds: Normal breath sounds.  Abdominal:     General: Abdomen is flat. Bowel sounds are normal.     Palpations: Abdomen is soft.  Musculoskeletal:     Cervical back: Neck supple.  Skin:    General: Skin is warm and dry.     Coloration: Skin is not jaundiced or pale.  Neurological:     Mental Status: Patient is alert and oriented to person, place, and time.  Psychiatric:        Mood and Affect: Mood normal.        Behavior: Behavior normal.        Judgment: Judgment normal.    MD Evaluation Airway: WNL Heart: WNL Abdomen: WNL Chest/ Lungs: WNL ASA  Classification: 2 Mallampati/Airway Score: Two  Imaging: No results found.  Labs:  CBC: Recent Labs    05/24/23 0800 06/04/23 0800  WBC 4.9 5.3  HGB 12.1* 12.3*  HCT 37.4* 37.5*  PLT 219 208    COAGS: Recent Labs    06/04/23 0800  INR 1.1    BMP: Recent Labs    05/24/23 0800 06/04/23 0800  NA 140 140  K 3.5 3.9  CL 106 107  CO2 25 23  GLUCOSE 114* 111*  BUN 20 20  CALCIUM 9.5 8.8*  CREATININE 1.07 0.99  GFRNONAA >60 >60    LIVER FUNCTION TESTS: No results for input(s): "BILITOT", "AST", "ALT", "ALKPHOS", "PROT", "ALBUMIN" in the last 8760 hours.  TUMOR MARKERS: No results for input(s): "AFPTM", "CEA", "CA199", "CHROMGRNA" in the last 8760 hours.  Assessment and Plan: 47 y.o. male with left kidney stones scheduled for L PCNL who presents for L PCN placement by IR.   NPO since MN VSS CBC stable RF wnl  INR 1.1 Not on AC/AP   Risks and benefits of Left PCN placement was discussed with the patient including, but not limited to, infection, bleeding, significant bleeding causing loss or decrease in renal function or damage to adjacent structures.   All of the patient's questions were answered, patient is agreeable to proceed.  Consent signed and in chart.   Thank you for this interesting consult.  I greatly enjoyed meeting Noah Harris and look  forward to participating in their care.  A copy of this report was sent to the requesting provider on this date.  Electronically Signed: Willette Brace, PA-C 06/04/2023, 8:41 AM   I spent a total of  30 Minutes   in face to face in clinical consultation, greater than 50% of which was counseling/coordinating care for L PCN placement.  This chart was dictated using  voice recognition software.  Despite best efforts to proofread,  errors can occur which can change the documentation meaning.

## 2023-06-04 ENCOUNTER — Ambulatory Visit (HOSPITAL_COMMUNITY): Payer: Commercial Managed Care - PPO | Admitting: Certified Registered Nurse Anesthetist

## 2023-06-04 ENCOUNTER — Ambulatory Visit (HOSPITAL_COMMUNITY)
Admission: RE | Admit: 2023-06-04 | Discharge: 2023-06-04 | Disposition: A | Discharge status: Home / Self Care | Payer: Commercial Managed Care - PPO | Source: Ambulatory Visit | Attending: Urology | Admitting: Urology

## 2023-06-04 ENCOUNTER — Ambulatory Visit (HOSPITAL_COMMUNITY): Payer: Commercial Managed Care - PPO

## 2023-06-04 ENCOUNTER — Other Ambulatory Visit: Payer: Self-pay

## 2023-06-04 ENCOUNTER — Observation Stay (HOSPITAL_COMMUNITY): Admission: RE | Admit: 2023-06-04 | Payer: Commercial Managed Care - PPO | Source: Home / Self Care | Admitting: Urology

## 2023-06-04 ENCOUNTER — Encounter (HOSPITAL_COMMUNITY)
Admission: RE | Disposition: A | Discharge status: Home / Self Care | Payer: Self-pay | Source: Home / Self Care | Attending: Urology

## 2023-06-04 ENCOUNTER — Ambulatory Visit (HOSPITAL_BASED_OUTPATIENT_CLINIC_OR_DEPARTMENT_OTHER): Payer: Commercial Managed Care - PPO | Admitting: Certified Registered Nurse Anesthetist

## 2023-06-04 ENCOUNTER — Encounter (HOSPITAL_COMMUNITY): Payer: Self-pay | Admitting: Urology

## 2023-06-04 ENCOUNTER — Encounter (HOSPITAL_COMMUNITY): Payer: Self-pay

## 2023-06-04 DIAGNOSIS — I10 Essential (primary) hypertension: Secondary | ICD-10-CM | POA: Diagnosis not present

## 2023-06-04 DIAGNOSIS — N200 Calculus of kidney: Secondary | ICD-10-CM | POA: Diagnosis present

## 2023-06-04 DIAGNOSIS — Z79899 Other long term (current) drug therapy: Secondary | ICD-10-CM | POA: Diagnosis not present

## 2023-06-04 DIAGNOSIS — N23 Unspecified renal colic: Secondary | ICD-10-CM

## 2023-06-04 HISTORY — PX: NEPHROLITHOTOMY: SHX5134

## 2023-06-04 HISTORY — PX: IR URETERAL STENT LEFT NEW ACCESS W/O SEP NEPHROSTOMY CATH: IMG6075

## 2023-06-04 LAB — CBC WITH DIFFERENTIAL/PLATELET
Abs Immature Granulocytes: 0.02 10*3/uL (ref 0.00–0.07)
Basophils Absolute: 0.1 10*3/uL (ref 0.0–0.1)
Basophils Relative: 1 %
Eosinophils Absolute: 0.1 10*3/uL (ref 0.0–0.5)
Eosinophils Relative: 2 %
HCT: 37.5 % — ABNORMAL LOW (ref 39.0–52.0)
Hemoglobin: 12.3 g/dL — ABNORMAL LOW (ref 13.0–17.0)
Immature Granulocytes: 0 %
Lymphocytes Relative: 38 %
Lymphs Abs: 2 10*3/uL (ref 0.7–4.0)
MCH: 27.6 pg (ref 26.0–34.0)
MCHC: 32.8 g/dL (ref 30.0–36.0)
MCV: 84.1 fL (ref 80.0–100.0)
Monocytes Absolute: 0.4 10*3/uL (ref 0.1–1.0)
Monocytes Relative: 7 %
Neutro Abs: 2.7 10*3/uL (ref 1.7–7.7)
Neutrophils Relative %: 52 %
Platelets: 208 10*3/uL (ref 150–400)
RBC: 4.46 MIL/uL (ref 4.22–5.81)
RDW: 12.3 % (ref 11.5–15.5)
WBC: 5.3 10*3/uL (ref 4.0–10.5)
nRBC: 0 % (ref 0.0–0.2)

## 2023-06-04 LAB — BASIC METABOLIC PANEL
Anion gap: 10 (ref 5–15)
Anion gap: 10 (ref 5–15)
BUN: 20 mg/dL (ref 6–20)
BUN: 20 mg/dL (ref 6–20)
CO2: 23 mmol/L (ref 22–32)
CO2: 24 mmol/L (ref 22–32)
Calcium: 8.8 mg/dL — ABNORMAL LOW (ref 8.9–10.3)
Calcium: 8.6 mg/dL — ABNORMAL LOW (ref 8.9–10.3)
Chloride: 107 mmol/L (ref 98–111)
Chloride: 106 mmol/L (ref 98–111)
Creatinine, Ser: 0.99 mg/dL (ref 0.61–1.24)
Creatinine, Ser: 1.03 mg/dL (ref 0.61–1.24)
GFR, Estimated: >60 mL/min (ref >60)
GFR, Estimated: >60 mL/min (ref >60)
Glucose, Bld: 111 mg/dL — ABNORMAL HIGH (ref 70–99)
Glucose, Bld: 158 mg/dL — ABNORMAL HIGH (ref 70–99)
Potassium: 3.9 mmol/L (ref 3.5–5.1)
Potassium: 3.5 mmol/L (ref 3.5–5.1)
Sodium: 140 mmol/L (ref 135–145)
Sodium: 140 mmol/L (ref 135–145)

## 2023-06-04 LAB — HIV ANTIBODY (ROUTINE TESTING W REFLEX): HIV Screen 4th Generation wRfx: NONREACTIVE

## 2023-06-04 LAB — HEMOGLOBIN AND HEMATOCRIT, BLOOD
HCT: 38.3 % — ABNORMAL LOW (ref 39.0–52.0)
Hemoglobin: 12.3 g/dL — ABNORMAL LOW (ref 13.0–17.0)

## 2023-06-04 LAB — PROTIME-INR
INR: 1.1 (ref 0.8–1.2)
Prothrombin Time: 14.3 seconds (ref 11.4–15.2)

## 2023-06-04 LAB — ABO/RH: ABO/RH(D): O POS

## 2023-06-04 SURGERY — NEPHROLITHOTOMY PERCUTANEOUS
Anesthesia: General | Laterality: Left

## 2023-06-04 MED ORDER — DIPHENHYDRAMINE HCL 50 MG/ML IJ SOLN: 12.5000 mg | Freq: Four times a day (QID) | INTRAMUSCULAR | Status: DC | PRN

## 2023-06-04 MED ORDER — MIDAZOLAM HCL 2 MG/2ML IJ SOLN
INTRAMUSCULAR | Status: AC | PRN
Start: 2023-06-04 — End: 2023-06-04
  Administered 2023-06-04: 1 mg via INTRAVENOUS

## 2023-06-04 MED ORDER — MIDAZOLAM HCL 2 MG/2ML IJ SOLN
INTRAMUSCULAR | Status: AC | PRN
  Administered 2023-06-04: 1 mg via INTRAVENOUS

## 2023-06-04 MED ORDER — PROPOFOL 10 MG/ML IV BOLUS
INTRAVENOUS | Status: DC | PRN
Start: 2023-06-04 — End: 2023-06-04
  Administered 2023-06-04: 200 mg via INTRAVENOUS

## 2023-06-04 MED ORDER — ONDANSETRON HCL 4 MG/2ML IJ SOLN: 4.0000 mg | INTRAMUSCULAR | Status: DC | PRN

## 2023-06-04 MED ORDER — OXYCODONE HCL 5 MG PO TABS: 5.0000 mg | ORAL_TABLET | Freq: Once | ORAL | Status: DC | PRN

## 2023-06-04 MED ORDER — OXYCODONE HCL 5 MG/5ML PO SOLN: 5.0000 mg | Freq: Once | ORAL | Status: DC | PRN

## 2023-06-04 MED ORDER — LIDOCAINE-EPINEPHRINE 1 %-1:100000 IJ SOLN
INTRAMUSCULAR | Status: AC
  Filled 2023-06-04: qty 1

## 2023-06-04 MED ORDER — SODIUM CHLORIDE 0.9 % IV SOLN
2.0000 g | INTRAVENOUS | Status: AC
  Administered 2023-06-04: 2 g via INTRAVENOUS
  Filled 2023-06-04: qty 20

## 2023-06-04 MED ORDER — ONDANSETRON HCL 4 MG/2ML IJ SOLN
INTRAMUSCULAR | Status: DC | PRN
  Administered 2023-06-04: 4 mg via INTRAVENOUS

## 2023-06-04 MED ORDER — MIDAZOLAM HCL 2 MG/2ML IJ SOLN
INTRAMUSCULAR | Status: AC
  Filled 2023-06-04: qty 4

## 2023-06-04 MED ORDER — FENTANYL CITRATE PF 50 MCG/ML IJ SOSY
PREFILLED_SYRINGE | INTRAMUSCULAR | Status: AC
  Filled 2023-06-04: qty 1

## 2023-06-04 MED ORDER — LISINOPRIL 20 MG PO TABS
20.0000 mg | ORAL_TABLET | Freq: Every day | ORAL | Status: DC
  Filled 2023-06-04: qty 1

## 2023-06-04 MED ORDER — FENOFIBRATE 160 MG PO TABS
160.0000 mg | ORAL_TABLET | Freq: Every day | ORAL | Status: DC
  Filled 2023-06-04: qty 1

## 2023-06-04 MED ORDER — CHLORHEXIDINE GLUCONATE 0.12 % MT SOLN
15.0000 mL | Freq: Once | OROMUCOSAL | Status: AC
  Administered 2023-06-04: 15 mL via OROMUCOSAL

## 2023-06-04 MED ORDER — LISINOPRIL-HYDROCHLOROTHIAZIDE 20-12.5 MG PO TABS: 1.0000 | ORAL_TABLET | Freq: Every morning | ORAL | Status: DC

## 2023-06-04 MED ORDER — CEFAZOLIN SODIUM-DEXTROSE 2-4 GM/100ML-% IV SOLN
2.0000 g | INTRAVENOUS | Status: DC
  Filled 2023-06-04: qty 100

## 2023-06-04 MED ORDER — FENTANYL CITRATE (PF) 100 MCG/2ML IJ SOLN
INTRAMUSCULAR | Status: AC | PRN
Start: 2023-06-04 — End: 2023-06-04
  Administered 2023-06-04: 50 ug via INTRAVENOUS

## 2023-06-04 MED ORDER — HYDROCODONE-ACETAMINOPHEN 5-325 MG PO TABS: 1.0000 | ORAL_TABLET | Freq: Four times a day (QID) | ORAL | 0 refills | Status: AC | PRN

## 2023-06-04 MED ORDER — LIDOCAINE 2% (20 MG/ML) 5 ML SYRINGE
INTRAMUSCULAR | Status: DC | PRN
  Administered 2023-06-04: 100 mg via INTRAVENOUS

## 2023-06-04 MED ORDER — DROPERIDOL 2.5 MG/ML IJ SOLN: 0.6250 mg | Freq: Once | INTRAMUSCULAR | Status: DC | PRN

## 2023-06-04 MED ORDER — IOHEXOL 300 MG/ML  SOLN
50.0000 mL | Freq: Once | INTRAMUSCULAR | Status: AC | PRN
  Administered 2023-06-04: 10 mL

## 2023-06-04 MED ORDER — FENTANYL CITRATE (PF) 100 MCG/2ML IJ SOLN
INTRAMUSCULAR | Status: AC
  Filled 2023-06-04: qty 4

## 2023-06-04 MED ORDER — DIPHENHYDRAMINE HCL 12.5 MG/5ML PO ELIX: 12.5000 mg | ORAL_SOLUTION | Freq: Four times a day (QID) | ORAL | Status: DC | PRN

## 2023-06-04 MED ORDER — ROCURONIUM BROMIDE 10 MG/ML (PF) SYRINGE
PREFILLED_SYRINGE | INTRAVENOUS | Status: DC | PRN
  Administered 2023-06-04: 60 mg via INTRAVENOUS

## 2023-06-04 MED ORDER — DEXAMETHASONE SODIUM PHOSPHATE 4 MG/ML IJ SOLN
INTRAMUSCULAR | Status: DC | PRN
  Administered 2023-06-04: 10 mg via INTRAVENOUS

## 2023-06-04 MED ORDER — SODIUM CHLORIDE 0.9 % IV SOLN: INTRAVENOUS | Status: DC

## 2023-06-04 MED ORDER — LIDOCAINE-EPINEPHRINE 1 %-1:100000 IJ SOLN
20.0000 mL | Freq: Once | INTRAMUSCULAR | Status: AC
  Administered 2023-06-04: 20 mL via INTRADERMAL

## 2023-06-04 MED ORDER — ACETAMINOPHEN 325 MG PO TABS
650.0000 mg | ORAL_TABLET | ORAL | Status: DC | PRN
  Administered 2023-06-04 – 2023-06-05 (×3): 650 mg via ORAL
  Filled 2023-06-04 (×3): qty 2

## 2023-06-04 MED ORDER — SODIUM CHLORIDE 0.9 % IR SOLN
Status: DC | PRN
Start: 2023-06-04 — End: 2023-06-04
  Administered 2023-06-04 (×2): 6000 mL

## 2023-06-04 MED ORDER — LACTATED RINGERS IV SOLN: INTRAVENOUS | Status: DC

## 2023-06-04 MED ORDER — FENTANYL CITRATE (PF) 250 MCG/5ML IJ SOLN
INTRAMUSCULAR | Status: AC
  Filled 2023-06-04: qty 5

## 2023-06-04 MED ORDER — DOCUSATE SODIUM 100 MG PO CAPS
100.0000 mg | ORAL_CAPSULE | Freq: Two times a day (BID) | ORAL | Status: DC
  Administered 2023-06-04 – 2023-06-05 (×2): 100 mg via ORAL
  Filled 2023-06-04 (×2): qty 1

## 2023-06-04 MED ORDER — LIDOCAINE HCL (PF) 2 % IJ SOLN
INTRAMUSCULAR | Status: AC
  Filled 2023-06-04: qty 5

## 2023-06-04 MED ORDER — SODIUM CHLORIDE 0.9 % IR SOLN
Status: DC | PRN
  Administered 2023-06-04: 1000 mL

## 2023-06-04 MED ORDER — ZOLPIDEM TARTRATE 5 MG PO TABS: 5.0000 mg | ORAL_TABLET | Freq: Every evening | ORAL | Status: DC | PRN

## 2023-06-04 MED ORDER — SUGAMMADEX SODIUM 200 MG/2ML IV SOLN
INTRAVENOUS | Status: DC | PRN
  Administered 2023-06-04: 200 mg via INTRAVENOUS

## 2023-06-04 MED ORDER — FENTANYL CITRATE (PF) 100 MCG/2ML IJ SOLN
INTRAMUSCULAR | Status: DC | PRN
  Administered 2023-06-04 (×2): 100 ug via INTRAVENOUS
  Administered 2023-06-04: 50 ug via INTRAVENOUS

## 2023-06-04 MED ORDER — DEXAMETHASONE SODIUM PHOSPHATE 10 MG/ML IJ SOLN
INTRAMUSCULAR | Status: AC
  Filled 2023-06-04: qty 1

## 2023-06-04 MED ORDER — TRIPLE ANTIBIOTIC 3.5-400-5000 EX OINT: 1.0000 | TOPICAL_OINTMENT | Freq: Three times a day (TID) | CUTANEOUS | Status: DC | PRN

## 2023-06-04 MED ORDER — SENNA 8.6 MG PO TABS
1.0000 | ORAL_TABLET | Freq: Two times a day (BID) | ORAL | Status: DC
  Administered 2023-06-04 – 2023-06-05 (×2): 8.6 mg via ORAL
  Filled 2023-06-04 (×2): qty 1

## 2023-06-04 MED ORDER — OXYCODONE HCL 5 MG PO TABS
5.0000 mg | ORAL_TABLET | ORAL | Status: DC | PRN
  Administered 2023-06-04: 5 mg via ORAL
  Filled 2023-06-04: qty 1

## 2023-06-04 MED ORDER — IOHEXOL 300 MG/ML  SOLN
INTRAMUSCULAR | Status: DC | PRN
  Administered 2023-06-04: 15 mL

## 2023-06-04 MED ORDER — HYDROMORPHONE HCL 1 MG/ML IJ SOLN: 0.5000 mg | INTRAMUSCULAR | Status: DC | PRN

## 2023-06-04 MED ORDER — PROPOFOL 10 MG/ML IV BOLUS
INTRAVENOUS | Status: AC
  Filled 2023-06-04: qty 20

## 2023-06-04 MED ORDER — ORAL CARE MOUTH RINSE: 15.0000 mL | Freq: Once | OROMUCOSAL | Status: AC

## 2023-06-04 MED ORDER — ONDANSETRON HCL 4 MG/2ML IJ SOLN
INTRAMUSCULAR | Status: AC
  Filled 2023-06-04: qty 2

## 2023-06-04 MED ORDER — FENTANYL CITRATE PF 50 MCG/ML IJ SOSY
25.0000 ug | PREFILLED_SYRINGE | INTRAMUSCULAR | Status: DC | PRN
  Administered 2023-06-04: 25 ug via INTRAVENOUS

## 2023-06-04 MED ORDER — TAMSULOSIN HCL 0.4 MG PO CAPS: 0.4000 mg | ORAL_CAPSULE | Freq: Every day | ORAL | 1 refills | Status: AC

## 2023-06-04 MED ORDER — DEXMEDETOMIDINE HCL IN NACL 80 MCG/20ML IV SOLN
INTRAVENOUS | Status: DC | PRN
Start: 2023-06-04 — End: 2023-06-04
  Administered 2023-06-04 (×2): 8 ug via INTRAVENOUS

## 2023-06-04 MED ORDER — HYDROCHLOROTHIAZIDE 12.5 MG PO TABS
12.5000 mg | ORAL_TABLET | Freq: Every day | ORAL | Status: DC
  Filled 2023-06-04: qty 1

## 2023-06-04 MED ORDER — OXYBUTYNIN CHLORIDE 5 MG PO TABS: 5.0000 mg | ORAL_TABLET | Freq: Three times a day (TID) | ORAL | Status: DC | PRN

## 2023-06-04 MED ORDER — ACETAMINOPHEN 500 MG PO TABS
1000.0000 mg | ORAL_TABLET | Freq: Once | ORAL | Status: AC
  Administered 2023-06-04: 1000 mg via ORAL
  Filled 2023-06-04: qty 2

## 2023-06-04 SURGICAL SUPPLY — 54 items
APL PRP STRL LF DISP 70% ISPRP (MISCELLANEOUS) ×1
APL SKNCLS STERI-STRIP NONHPOA (GAUZE/BANDAGES/DRESSINGS) ×1
BAG COUNTER SPONGE SURGICOUNT (BAG) IMPLANT
BAG DRN RND TRDRP ANRFLXCHMBR (UROLOGICAL SUPPLIES)
BAG SPNG CNTER NS LX DISP (BAG)
BAG URINE DRAIN 2000ML AR STRL (UROLOGICAL SUPPLIES) IMPLANT
BASKET ZERO TIP NITINOL 2.4FR (BASKET) IMPLANT
BENZOIN TINCTURE PRP APPL 2/3 (GAUZE/BANDAGES/DRESSINGS) ×1 IMPLANT
BLADE SURG 15 STRL LF DISP TIS (BLADE) ×1 IMPLANT
BLADE SURG 15 STRL SS (BLADE) ×1
BSKT STON RTRVL ZERO TP 2.4FR (BASKET)
CATH FOLEY 2W COUNCIL 20FR 5CC (CATHETERS) IMPLANT
CATH ROBINSON RED A/P 20FR (CATHETERS) IMPLANT
CATH URETERAL DUAL LUMEN 10F (MISCELLANEOUS) ×1 IMPLANT
CATH X-FORCE N30 NEPHROSTOMY (TUBING) ×1 IMPLANT
CHLORAPREP W/TINT 26 (MISCELLANEOUS) ×1 IMPLANT
COVER BACK TABLE 60X90IN (DRAPES) ×1 IMPLANT
COVER SURGICAL LIGHT HANDLE (MISCELLANEOUS) IMPLANT
DRAPE C-ARM 42X120 X-RAY (DRAPES) ×1 IMPLANT
DRAPE LINGEMAN PERC (DRAPES) ×1 IMPLANT
DRAPE SURG IRRIG POUCH 19X23 (DRAPES) ×1 IMPLANT
DRSG TEGADERM 4X4.75 (GAUZE/BANDAGES/DRESSINGS) IMPLANT
DRSG TEGADERM 8X12 (GAUZE/BANDAGES/DRESSINGS) IMPLANT
GAUZE PAD ABD 8X10 STRL (GAUZE/BANDAGES/DRESSINGS) ×2 IMPLANT
GAUZE SPONGE 4X4 12PLY STRL (GAUZE/BANDAGES/DRESSINGS) IMPLANT
GLOVE BIO SURGEON STRL SZ7.5 (GLOVE) ×1 IMPLANT
GOWN STRL REUS W/ TWL XL LVL3 (GOWN DISPOSABLE) ×1 IMPLANT
GOWN STRL REUS W/TWL XL LVL3 (GOWN DISPOSABLE) ×1
GUIDEWIRE AMPLAZ .035X145 (WIRE) ×2 IMPLANT
GUIDEWIRE STR DUAL SENSOR (WIRE) IMPLANT
KIT BASIN OR (CUSTOM PROCEDURE TRAY) ×1 IMPLANT
KIT PROBE TRILOGY 3.9X350 (MISCELLANEOUS) IMPLANT
KIT TURNOVER KIT A (KITS) IMPLANT
LASER FIB FLEXIVA PULSE ID 365 (Laser) IMPLANT
LUBRICANT JELLY K Y 4OZ (MISCELLANEOUS) ×1 IMPLANT
MANIFOLD NEPTUNE II (INSTRUMENTS) ×1 IMPLANT
NS IRRIG 1000ML POUR BTL (IV SOLUTION) ×1 IMPLANT
PACK CYSTO (CUSTOM PROCEDURE TRAY) IMPLANT
SPONGE T-LAP 4X18 ~~LOC~~+RFID (SPONGE) ×1 IMPLANT
STENT ENDOURETEROTOMY 7-14 26C (STENTS) IMPLANT
SUT CHROMIC 3 0 SH 27 (SUTURE) IMPLANT
SUT MON AB 3-0 SH 27 (SUTURE) ×1
SUT MON AB 3-0 SH27 (SUTURE) IMPLANT
SUT SILK 2 0 30 PSL (SUTURE) IMPLANT
SYR 10ML LL (SYRINGE) ×1 IMPLANT
SYR 20ML LL LF (SYRINGE) ×1 IMPLANT
TOWEL OR 17X26 10 PK STRL BLUE (TOWEL DISPOSABLE) ×1 IMPLANT
TRACTIP FLEXIVA PULS ID 200XHI (Laser) IMPLANT
TRACTIP FLEXIVA PULSE ID 200 (Laser)
TRAY FOLEY MTR SLVR 16FR STAT (SET/KITS/TRAYS/PACK) ×1 IMPLANT
TUBING CONNECTING 10 (TUBING) ×1 IMPLANT
TUBING STONE CATCHER TRILOGY (MISCELLANEOUS) IMPLANT
TUBING UROLOGY SET (TUBING) ×1 IMPLANT
WATER STERILE IRR 1000ML POUR (IV SOLUTION) ×1 IMPLANT

## 2023-06-04 NOTE — Anesthesia Procedure Notes (Signed)
Procedure Name: Intubation Date/Time: 06/04/2023 11:40 AM  Performed by: Vanessa Adamsburg, CRNAPre-anesthesia Checklist: Patient identified, Emergency Drugs available, Suction available and Patient being monitored Patient Re-evaluated:Patient Re-evaluated prior to induction Oxygen Delivery Method: Circle system utilized Preoxygenation: Pre-oxygenation with 100% oxygen Induction Type: IV induction Ventilation: Mask ventilation without difficulty and Oral airway inserted - appropriate to patient size Laryngoscope Size: 2 and Miller Grade View: Grade II Tube type: Oral Tube size: 7.5 mm Number of attempts: 1 Airway Equipment and Method: Stylet Placement Confirmation: ETT inserted through vocal cords under direct vision, positive ETCO2 and breath sounds checked- equal and bilateral Secured at: 22 cm Tube secured with: Tape Dental Injury: Teeth and Oropharynx as per pre-operative assessment

## 2023-06-04 NOTE — Transfer of Care (Signed)
Immediate Anesthesia Transfer of Care Note  Patient: Noah Harris  Procedure(s) Performed: LEFT NEPHROLITHOTOMY PERCUTANEOUS AND STENT PLACEMENT (Left)  Patient Location: PACU  Anesthesia Type:General  Level of Consciousness: pateint uncooperative  Airway & Oxygen Therapy: Patient Spontanous Breathing and Patient connected to face mask  Post-op Assessment: Report given to RN and Post -op Vital signs reviewed and stable  Post vital signs: Reviewed and stable  Last Vitals:  Vitals Value Taken Time  BP 149/106 06/04/23 1320  Temp    Pulse 67 06/04/23 1323  Resp 8 06/04/23 1323  SpO2 88 % 06/04/23 1323  Vitals shown include unfiled device data.  Last Pain:  Vitals:   06/04/23 0808  PainSc: 0-No pain      Patients Stated Pain Goal: 6 (06/04/23 5409)  Complications: No notable events documented.

## 2023-06-04 NOTE — H&P (Signed)
CC/HPI: CC: Large left renal calculus  HPI:  04/19/2023  47 year old male has been having several months of left-sided flank pain. Can be severe at times. Underwent a CT scan on 04/16/2023 that revealed mild left hydronephrosis with a 15 mm calculus in the left renal pelvis. No ureteral calculi identified. He also had some lower pole calculi on the left. He had some upper pole calculi on the right. Patient denies any fever, chill, nausea, vomiting. He has had stones before. Has never had to have intervention. Always passed them.   06/04/2023 Patient presents today for PCNL    ALLERGIES: No Allergies    MEDICATIONS: Lisinopril  Fenofibrate  Vitamin C  Vitamin D     GU PSH: None     PSH Notes: No Surgical Problems   NON-GU PSH: Appendectomy     GU PMH: Renal calculus, Nephrolithiasis - 2014 Ureteral calculus, Calculus of ureter - 2014      PMH Notes:  1898-10-05 00:00:00 - Note: Normal Routine History And Physical Adult   NON-GU PMH: Personal history of other diseases of the circulatory system, History of hypertension - 2014 Personal history of other diseases of the nervous system and sense organs, History of sleep apnea - 2014 Hypercholesterolemia Hypertension Sleep Apnea    FAMILY HISTORY: Brain Cancer - Father Death In The Family Father - Father Family Health Status - Mother's Age - Mother Family Health Status Number - No Family History   SOCIAL HISTORY: Marital Status: Married Current Smoking Status: Patient has never smoked.   Tobacco Use Assessment Completed: Used Tobacco in last 30 days? Does not use smokeless tobacco. Does drink.  Drinks 1 caffeinated drink per day. 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    REVIEW OF SYSTEMS:    GU Review Male:   Patient denies frequent urination, hard to postpone urination, burning/ pain with urination, get up at night to urinate, leakage of urine, stream starts and stops, trouble starting your stream, have to strain to  urinate , erection problems, and penile pain.  Gastrointestinal (Upper):   Patient reports nausea and vomiting. Patient denies indigestion/ heartburn.  Gastrointestinal (Lower):   Patient denies diarrhea and constipation.  Constitutional:   Patient denies fever, night sweats, weight loss, and fatigue.  Skin:   Patient denies skin rash/ lesion and itching.  Eyes:   Patient denies blurred vision and double vision.  Ears/ Nose/ Throat:   Patient denies sore throat and sinus problems.  Hematologic/Lymphatic:   Patient denies swollen glands and easy bruising.  Cardiovascular:   Patient denies leg swelling and chest pains.  Respiratory:   Patient denies shortness of breath and cough.  Endocrine:   Patient denies excessive thirst.  Musculoskeletal:   Patient reports back pain. Patient denies joint pain.  Neurological:   Patient denies headaches and dizziness.  Psychologic:   Patient denies depression and anxiety.   Ht 5\' 10"  (1.778 m)   Wt 99.6 kg   BMI 31.49 kg/m    MULTI-SYSTEM PHYSICAL EXAMINATION:    Constitutional: Well-nourished. No physical deformities. Normally developed. Good grooming.  Neck: Neck symmetrical, not swollen. Normal tracheal position.  Respiratory: No labored breathing, no use of accessory muscles.   Cardiovascular: Normal temperature, normal extremity pulses, no swelling, no varicosities.  Lymphatic: No enlargement of neck, axillae, groin.  Skin: No paleness, no jaundice, no cyanosis. No lesion, no ulcer, no rash.  Neurologic / Psychiatric: Oriented to time, oriented to place, oriented to person. No depression, no anxiety, no agitation.  Gastrointestinal:  No mass, no tenderness, no rigidity, non obese abdomen.  Eyes: Normal conjunctivae. Normal eyelids.  Ears, Nose, Mouth, and Throat: Left ear no scars, no lesions, no masses. Right ear no scars, no lesions, no masses. Nose no scars, no lesions, no masses. Normal hearing. Normal lips.  Musculoskeletal: Normal gait and  station of head and neck.         ASSESSMENT:      ICD-10 Details  1 GU:   Renal calculus - N20.0 Undiagnosed New Problem  2   Flank Pain - R10.84 Undiagnosed New Problem   PLAN:           Orders Labs Urine Culture          Document Letter(s):  Created for Patient: Clinical Summary         Notes:   We discussed the management of urinary stones. These options include observation, ureteroscopy, and shockwave lithotripsy. We discussed which options are relevant to these particular stones. We discussed the natural history of stones as well as the complications of untreated stones and the impact on quality of life without treatment as well as with each of the above listed treatments. We also discussed the efficacy of each treatment in its ability to clear the stone burden. With any of these management options I discussed the signs and symptoms of infection and the need for emergent treatment should these be experienced. For each option we discussed the ability of each procedure to clear the patient of their stone burden.   For observation I described the risks which include but are not limited to silent renal damage, life-threatening infection, need for emergent surgery, failure to pass stone, and pain.   For ureteroscopy I described the risks which include heart attack, stroke, pulmonary embolus, death, bleeding, infection, damage to contiguous structures, positioning injury, ureteral stricture, ureteral avulsion, ureteral injury, need for ureteral stent, inability to perform ureteroscopy, need for an interval procedure, inability to clear stone burden, stent discomfort and pain.   For shockwave lithotripsy I described the risks which include arrhythmia, kidney contusion, kidney hemorrhage, need for transfusion, pain, inability to break up stone, inability to pass stone fragments, Steinstrasse, infection associated with obstructing stones, need for different surgical procedure, need for  repeat shockwave lithotripsy.   Patient would like proceed with left PCNL.   CC: Dr. Docia Chuck

## 2023-06-04 NOTE — Discharge Instructions (Signed)
Discharge instructions following PCNL ° °Call your doctor for: °Fevers greater than 100.5 °Severe nausea or vomiting °Increasing pain not controlled by pain medication °Increasing redness or drainage from incisions °Decreased urine output or a catheter is no longer draining ° °The number for questions is [phone]. ° °Activity: °Gradually increase activity with short frequent walks, 3-4 times a day.  Avoid strenuous activities, like sports, lawn-mowing, or heavy lifting (more than 10-15 pounds).  Wear loose, comfortable clothing that pull or kink the tube or tubes.  Do not drive while taking pain medication, or until your doctor permitts it. ° °Bathing and dressing changes: °You should not shower for 48 hours after surgery.  Do not soak your back in a bathtub. ° °Drainage bag care: °You may be discharged with a drainage bag around the site of your surgery.  The drainage bag should be secured such that it never pulls or loosens to prevent it from leaking.  It is important to wash her hands before and after emptying the drainage bag to help prevent the spread of infection.  The drainage bag should be emptied as needed.  When the wound stops draining or it is manageable with a dry gauze dressing, you can remove the bag. ° °If your tube in the back was removed, you should expect to have some leakage of fluid from the back incision.  This should slowly decrease and stop over the next couple of days.  If you have severe pain or persistent leakage, please call the number above.  Otherwise, your dressing can be changed 1-2 times daily or more if needed. ° °Diet: °It is extremely important to drink plenty of fluids after surgery, especially water.  You may resume your regular diet, unless otherwise instructed. ° °Medications: °May take Tylenol (acetaminophen) or ibuprofen (Advil, Motrin) as directed over-the-counter. °Take any prescriptions as directed. ° °Follow-up appointments: °Follow-up appointment will be scheduled  with Dr. Bell °

## 2023-06-04 NOTE — Procedures (Signed)
Interventional Radiology Procedure Note  Procedure: Image guided left renal access for antegrade nephrolithotomy.     Findings:  38F catheter via 38F sheath, transmitted through lower/posterior calyx to the bladder.  The catheter will accept any 035wire to the bladder.   Complications: None  EBL: None    Recommendations: - Routine catheter care - NPO for OR today   - routine wound care  Signed,  Yvone Neu. Loreta Ave, DO

## 2023-06-04 NOTE — Op Note (Signed)
Operative Note  Preoperative diagnosis:  1.  Left renal calculus  Postoperative diagnosis: 1.  Left renal calculus   Procedure(s): 1.  Left percutaneous nephrolithotomy  Surgeon: Modena Slater, MD  Assistants: None  Anesthesia: General  Complications: None immediate  EBL: 100 cc  Specimens: 1.  Renal calculus  Drains/Catheters: 1.  6 x 26 double-J ureteral stent 2.  Foley catheter  Intraoperative findings: Large renal pelvis stone.  Some smaller lower pole calculi.  All stones removed  Indication: 46 year old male with a large left renal calculus presents for the previously mentioned operation.  Description of procedure:  The patient was identified and consent was obtained.  The patient was taken to the operating room and placed in the supine position.  The patient was placed under general anesthesia.  Perioperative antibiotics were administered.  The patient was placed in prone position and all pressure points were padded.  Patient was prepped and draped in a standard sterile fashion and a timeout was performed.  A Super Stiff wire was advanced through the nephroureteral stent down to the bladder under fluoroscopic guidance and the nephroureteral stent was removed.  A dual-lumen ureteral catheter was advanced over the Super Stiff wire into the renal pelvis and an antegrade nephrostogram was performed.  This showed a well opacified kidney and a filling defect corresponding to the stone of interest.  I advanced the dual-lumen ureteral catheter into the proximal ureter under fluoroscopic guidance followed by placement of a second Super Stiff wire down to the bladder under fluoroscopic guidance.  The dual-lumen catheter was removed.  An incision was made alongside the wires.  The balloon dilator was then advanced over one of the wires and into the renal pelvis fluoroscopic guidance and the tract was dilated to a pressure of 18.  The sheath was advanced over the balloon and into the  renal pelvis.  The balloon was withdrawn keeping the sheath in place.  The nephroscope was advanced into the kidney and the stone of interest was encountered.  The stone was then removed with a combination of pneumatic and ultrasound with suction followed by grasper extraction.  All stone was removed and there was no evidence of any other stones within the kidney.    A 6 x 26 double-J ureteral stent was advanced over 1 of the wires under fluoroscopic guidance and the wire was withdrawn.  Fluoroscopy confirmed a good coil within the bladder as well as a good coil in the renal pelvis proximally.  The other wire was removed.  I reinspected the kidney through the sheath and the stent was in good position in the renal pelvis.  I withdrew the scope and the access sheath.  I closed the incision with running 3-0 Monocryl.  The patient tolerated procedure well and was stable postoperatively.  Plan: Patient will remain under observation overnight. Stent to be removed in 1 week.

## 2023-06-04 NOTE — Anesthesia Postprocedure Evaluation (Signed)
Anesthesia Post Note  Patient: Noah Harris  Procedure(s) Performed: LEFT NEPHROLITHOTOMY PERCUTANEOUS AND STENT PLACEMENT (Left)     Patient location during evaluation: PACU Anesthesia Type: General Level of consciousness: awake and alert Pain management: pain level controlled Vital Signs Assessment: post-procedure vital signs reviewed and stable Respiratory status: spontaneous breathing, nonlabored ventilation and respiratory function stable Cardiovascular status: blood pressure returned to baseline Postop Assessment: no apparent nausea or vomiting Anesthetic complications: no   No notable events documented.  Last Vitals:  Vitals:   06/04/23 1345 06/04/23 1400  BP: (!) 143/88 (!) 137/93  Pulse: 76 64  Resp: 12 12  Temp:    SpO2: 98% 99%    Last Pain:  Vitals:   06/04/23 1345  PainSc: 0-No pain                 Shanda Howells

## 2023-06-05 ENCOUNTER — Encounter (HOSPITAL_COMMUNITY): Payer: Self-pay | Admitting: Urology

## 2023-06-05 DIAGNOSIS — N200 Calculus of kidney: Secondary | ICD-10-CM | POA: Diagnosis not present

## 2023-06-05 LAB — BASIC METABOLIC PANEL
Anion gap: 11 (ref 5–15)
BUN: 20 mg/dL (ref 6–20)
CO2: 22 mmol/L (ref 22–32)
Calcium: 9.2 mg/dL (ref 8.9–10.3)
Chloride: 104 mmol/L (ref 98–111)
Creatinine, Ser: 1.03 mg/dL (ref 0.61–1.24)
GFR, Estimated: >60 mL/min (ref >60)
Glucose, Bld: 165 mg/dL — ABNORMAL HIGH (ref 70–99)
Potassium: 3.7 mmol/L (ref 3.5–5.1)
Sodium: 137 mmol/L (ref 135–145)

## 2023-06-05 LAB — HEMOGLOBIN AND HEMATOCRIT, BLOOD
HCT: 34.6 % — ABNORMAL LOW (ref 39.0–52.0)
Hemoglobin: 11.1 g/dL — ABNORMAL LOW (ref 13.0–17.0)

## 2023-06-05 NOTE — Progress Notes (Signed)
Patient discharged to home with wife. PT ambulated without assistance from room to elevator w/nurse and wife. PT has received all education and discharge instructions prior to departing. PT denies any questions and knows information pertaining to follow up appointment with urology. PT PIV removed, meds given, all belongings collected, incision on back covered w/tecaderm per pt request, and vital signs stable prior to leaving.

## 2023-06-05 NOTE — Discharge Summary (Signed)
Physician Discharge Summary  Patient ID: Noah Harris MRN: [id number] DOB/AGE: 10-11-1975 47 y.o.  Admit date: 06/04/2023 Discharge date: 06/05/2023  Admission Diagnoses:  Discharge Diagnoses:  Principal Problem:   Renal calculi   Discharged Condition: good  Hospital Course: Patient underwent a left PCNL.  Observe overnight and the following day was voiding well with minimal pain and discomfort and labs stable.  He was discharged in stable condition  Consults: None  Significant Diagnostic Studies: None  Treatments: surgery: As above  Discharge Exam: Blood pressure (!) 156/94, pulse 70, temperature 97.8 F (36.6 C), temperature source Oral, resp. rate 20, height 5\' 10"  (1.778 m), weight 99.6 kg, SpO2 98%. General appearance: alert no acute distress Adequate perfusion of extremities Nonlabored respiration Abdomen soft, nontender, nondistended Left flank incision clean dry and intact  Disposition: Discharge disposition: 01-Home or Self Care        Allergies as of 06/05/2023   No Known Allergies      Medication List     TAKE these medications    cholecalciferol 25 MCG (1000 UNIT) tablet Commonly known as: VITAMIN D3 Take 1,000 Units by mouth every evening.   fenofibrate 145 MG tablet Commonly known as: TRICOR Take 145 mg by mouth in the morning.   HYDROcodone-acetaminophen 5-325 MG tablet Commonly known as: NORCO/VICODIN Take 1 tablet by mouth every 6 (six) hours as needed (pain).   ibuprofen 200 MG tablet Commonly known as: ADVIL Take 200 mg by mouth every 6 (six) hours as needed.   lisinopril-hydrochlorothiazide 20-12.5 MG tablet Commonly known as: ZESTORETIC Take 1 tablet by mouth in the morning.   tamsulosin 0.4 MG Caps capsule Commonly known as: FLOMAX Take 1 capsule (0.4 mg total) by mouth daily.   vitamin C 1000 MG tablet Take 1,000 mg by mouth every evening.         Signed: Ray Church, III 06/05/2023, 8:55 AM

## 2023-08-31 ENCOUNTER — Other Ambulatory Visit: Payer: Self-pay | Admitting: Urology

## 2023-08-31 ENCOUNTER — Encounter (HOSPITAL_BASED_OUTPATIENT_CLINIC_OR_DEPARTMENT_OTHER): Payer: Self-pay | Admitting: Urology

## 2023-08-31 NOTE — Progress Notes (Signed)
Pre Procedure Litho Phone Call: Spoke with client directly by phone, DOB confirmed along with correct name Informed to arrive Monday, Dec 2nd, 2024 at 0600hrs for Litho of Rt side Informed to be NPO after midnight on Sunday To refrain from any nicotine products or alcohol products Informed to NOT TAKE vitamins and anti hypertensive currently on med list Does have OSA and is bringing his CPAP machine as well Has no difficulty with DOE, orthopnea, or having any chest pain, is able to lie flat without issues Instructed to wear comfortable clothing and approp shoes such as sneakers Instructed on taking laxative on Sunday as well and rationale for was discussed No exposure to flu or Covid in the past 90 days was also investigated and questioned Does take Tylenol and Ibuprofen so was instructed to refrain from taking theses meds beginning this Friday.  Instructed to remain hydrated as much as possible this coming weekend with reminder to stop drinking and eating after midnight Sunday PM for Monday am procedure Opportunity for questions provided during and near end of phone call.

## 2023-09-06 ENCOUNTER — Encounter (HOSPITAL_BASED_OUTPATIENT_CLINIC_OR_DEPARTMENT_OTHER): Payer: Self-pay | Admitting: Urology

## 2023-09-06 ENCOUNTER — Encounter (HOSPITAL_BASED_OUTPATIENT_CLINIC_OR_DEPARTMENT_OTHER)
Admission: RE | Disposition: A | Discharge status: Home / Self Care | Payer: Self-pay | Source: Home / Self Care | Attending: Urology

## 2023-09-06 ENCOUNTER — Ambulatory Visit (HOSPITAL_BASED_OUTPATIENT_CLINIC_OR_DEPARTMENT_OTHER)
Admission: RE | Admit: 2023-09-06 | Discharge: 2023-09-06 | Disposition: A | Discharge status: Home / Self Care | Payer: Commercial Managed Care - PPO | Attending: Urology | Admitting: Urology

## 2023-09-06 ENCOUNTER — Other Ambulatory Visit: Payer: Self-pay

## 2023-09-06 ENCOUNTER — Ambulatory Visit (HOSPITAL_COMMUNITY): Payer: Commercial Managed Care - PPO

## 2023-09-06 DIAGNOSIS — N201 Calculus of ureter: Secondary | ICD-10-CM | POA: Diagnosis present

## 2023-09-06 HISTORY — PX: EXTRACORPOREAL SHOCK WAVE LITHOTRIPSY: SHX1557

## 2023-09-06 SURGERY — LITHOTRIPSY, ESWL
Anesthesia: LOCAL | Laterality: Right

## 2023-09-06 MED ORDER — DIPHENHYDRAMINE HCL 25 MG PO CAPS
ORAL_CAPSULE | ORAL | Status: AC
  Filled 2023-09-06: qty 1

## 2023-09-06 MED ORDER — DIAZEPAM 5 MG PO TABS
ORAL_TABLET | ORAL | Status: AC
  Filled 2023-09-06: qty 2

## 2023-09-06 MED ORDER — DIPHENHYDRAMINE HCL 25 MG PO CAPS
25.0000 mg | ORAL_CAPSULE | ORAL | Status: AC
  Administered 2023-09-06: 25 mg via ORAL

## 2023-09-06 MED ORDER — CIPROFLOXACIN HCL 500 MG PO TABS
500.0000 mg | ORAL_TABLET | ORAL | Status: AC
  Administered 2023-09-06: 500 mg via ORAL

## 2023-09-06 MED ORDER — CIPROFLOXACIN HCL 500 MG PO TABS
ORAL_TABLET | ORAL | Status: AC
  Filled 2023-09-06: qty 1

## 2023-09-06 MED ORDER — SODIUM CHLORIDE 0.9 % IV SOLN: INTRAVENOUS | Status: DC

## 2023-09-06 MED ORDER — DIAZEPAM 5 MG PO TABS
10.0000 mg | ORAL_TABLET | ORAL | Status: AC
  Administered 2023-09-06: 10 mg via ORAL

## 2023-09-06 NOTE — Op Note (Signed)
See [facility] OP note scanned into chart. Also because of the size, density, location and other factors that cannot be anticipated I feel this will likely be a staged procedure. This fact supersedes any indication in the scanned [facility] operative note to the contrary.  

## 2023-09-06 NOTE — Discharge Instructions (Signed)
See [facility] discharge instructions in chart.  

## 2023-09-06 NOTE — H&P (Signed)
CC/HPI: Right flank pain   Patient presents today for evaluation of persistent right-sided flank and back pain. The pain began 4 hours after he had a stent removed, and has been persistent now for 5 days. The pain is intermittently sharp and dull. Initially it did radiate around to his right lower quadrant and bladder, but currently and most commonly has remained in his right flank region. He has been using hydrocodone and heating pad for his symptoms. He has noted some passage of stone fragments. He has not had any hematuria. He denies any fevers or chills. He denies any nausea or vomiting.     ALLERGIES: No Allergies    MEDICATIONS: Allopurinol 200 mg tablet 1 tablet PO Daily  Allopurinol 300 mg tablet 1 tablet PO Daily  Lisinopril  Tamsulosin Hcl 0.4 mg capsule 1 capsule PO Daily  Fenofibrate  Vitamin C  Vitamin D     GU PSH: Cysto Remove Stent FB Sim - 08/26/2023, 06/11/2023       PSH Notes: No Surgical Problems   NON-GU PSH: Appendectomy     GU PMH: Renal calculus (Stable) - 08/26/2023, - 07/26/2023, - 06/11/2023, - 04/19/2023, Nephrolithiasis, - 2014 Flank Pain - 04/19/2023 Ureteral calculus, Calculus of ureter - 2014      PMH Notes:  1898-10-05 00:00:00 - Note: Normal Routine History And Physical Adult   NON-GU PMH: Personal history of other diseases of the circulatory system, History of hypertension - 2014 Personal history of other diseases of the nervous system and sense organs, History of sleep apnea - 2014 Hypercholesterolemia Hypertension Sleep Apnea    FAMILY HISTORY: Brain Cancer - Father Death In The Family Father - Father Family Health Status - Mother's Age - Mother Family Health Status Number - No Family History   SOCIAL HISTORY: Marital Status: Married Current Smoking Status: Patient has never smoked.   Tobacco Use Assessment Completed: Used Tobacco in last 30 days? Does not use smokeless tobacco. Does drink.  Drinks 1 caffeinated drink per  day. 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    REVIEW OF SYSTEMS:    GU Review Male:   Patient denies frequent urination, hard to postpone urination, burning/ pain with urination, get up at night to urinate, leakage of urine, stream starts and stops, trouble starting your stream, have to strain to urinate , erection problems, and penile pain.  Gastrointestinal (Upper):   Patient denies nausea, vomiting, and indigestion/ heartburn.  Gastrointestinal (Lower):   Patient denies diarrhea and constipation.  Constitutional:   Patient denies fever, night sweats, weight loss, and fatigue.  Skin:   Patient denies skin rash/ lesion and itching.  Eyes:   Patient denies blurred vision and double vision.  Ears/ Nose/ Throat:   Patient denies sore throat and sinus problems.  Hematologic/Lymphatic:   Patient denies swollen glands and easy bruising.  Cardiovascular:   Patient denies leg swelling and chest pains.  Respiratory:   Patient denies cough and shortness of breath.  Endocrine:   Patient denies excessive thirst.  Musculoskeletal:   Patient reports back pain. Patient denies joint pain.  Neurological:   Patient denies headaches and dizziness.  Psychologic:   Patient denies depression and anxiety.   Notes: Flank pain, pt is taking pain medication, pt continue passing pieces of stones.    VITAL SIGNS:      08/31/2023 11:07 AM  BP 150/102 mmHg  Pulse 78 /min  Temperature 97.1 F / 36.1 C   Complexity of Data:  Source Of History:  Patient  Records Review:   Previous Doctor Records, Previous Patient Records, POC Tool  Urine Test Review:   Urinalysis  Urodynamics Review:   Review Bladder Scan  X-Ray Review: KUB: Reviewed Films. Discussed With Patient.     PROCEDURES:         KUB - [id number]  A single view of the abdomen is obtained. Patient confirmed No Neulasta OnPro Device.  Renal shadows are easily visualized bilaterally. There are no stones appreciated within the expected location in either  renal pelvis. 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 There are no additional calcifications along the expected location of either ureter bilaterally.  Gas pattern is grossly normal. No significant bony abnormalities.      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   Qty: 60 Adm. By: Andree Moro  Unit: mg Lot No [id number]  Route: IM Exp. Date 07/05/2024  Freq: None Mfgr.:   Site: Right Hip   ASSESSMENT:      ICD-10 Details  1 GU:   Flank Pain - R10.84      PLAN:            Medications New Meds: Hydrocodone-Acetaminophen 5 mg-325 mg tablet 1 tablet PO Q 6 H PRN   #10  0 Refill(s)  Pharm[facility] Name:  [id number]  Address:  [address]   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 We discussed gust management strategies. Fortunately his pain is fairly well-controlled. After reviewing all his options the patient is opted to proceed with shockwave lithotripsy to shock the lead fragment. He will continue tamsulosin in the meantime. I am giving him a new prescription for his hydrocodone.   cc: Verdia Kuba, MD

## 2023-09-07 ENCOUNTER — Encounter (HOSPITAL_BASED_OUTPATIENT_CLINIC_OR_DEPARTMENT_OTHER): Payer: Self-pay | Admitting: Urology

## 2024-06-12 ENCOUNTER — Encounter (HOSPITAL_COMMUNITY): Payer: Self-pay | Admitting: Urology

## 2024-06-12 ENCOUNTER — Other Ambulatory Visit: Payer: Self-pay | Admitting: Urology

## 2024-06-12 NOTE — Progress Notes (Signed)
 Litho Phone Call: Attempted to contact client by phone, immediately went to voicemail. Voicemail left requesting client call back to discuss instructions and review of client chart, left phone number to call.

## 2024-06-12 NOTE — Progress Notes (Signed)
 Litho Nursing Note: Client immediately returned phone call Verified client with Name, DOB, Procedure and address Allergy Status reviewed Medication List also reviewed, instructed on medications to hold after Tuesday, Sept 9th Also discussed which medication to continue taking, also instructed client to do a bowel prep with OTC medication Instructed client to be NPO after midnight on Thursday for Friday procedure Does endorse having OSA and was instructed to bring sleep apnea home device for procedure Also instructed on appropriate attire for procedure as well Denies any flu like symptoms or recent Covid s&s. States wife will accompany client on day of procedure and being staying with client.  Offered Opportunity for questions prior to ending phone call.

## 2024-06-12 NOTE — Progress Notes (Signed)
 Left voicemail to arrive at the main entrance of [facility] at 1040. Bring driver's license, insurance card and blue folder from [facility], do not wear metal from waist down, including metal buckles, zippers, snaps, buttons, do not wear flip flops or sandals, do not take aspirin starting tomorrow and no NSAIDS (including ibuprofen , voltaren, aleve or any other NSAIDS after tomorrow and no vitamins or supplements after Wednesday morning. Do not bring money, valuables or credit cards,  On Thursday take laxative of choice, hydrate well and eat light meal that evening. We will try to call back.

## 2024-06-14 NOTE — H&P (Signed)
 Office Visit Report     06/09/2024   --------------------------------------------------------------------------------   Noah Harris  MRN: [id number]  DOB: 11/12/1975, 48 year old Male  SSN: [id number]   PRIMARY CARE:  Dibas Koirala, MD  PRIMARY CARE FAX:  [phone]  REFERRING:  Elfrieda Haggard, MD  PROVIDER:  Sherwood Carolee MOULD, M.D.  TREATING:  Ubaldo Eagles, NP  LOCATION:  [facility] [phone]     --------------------------------------------------------------------------------   CC/HPI: Right flank pain   Patient presents today for evaluation of persistent right-sided flank and back pain. The pain began 4 hours after he had a stent removed, and has been persistent now for 5 days. The pain is intermittently sharp and dull. Initially it did radiate around to his right lower quadrant and bladder, but currently and most commonly has remained in his right flank region. He has been using hydrocodone  and heating pad for his symptoms. He has noted some passage of stone fragments. He has not had any hematuria. He denies any fevers or chills. He denies any nausea or vomiting.   09/20/23: 48 year old male returns today after undergoing ESWL on 12/2. He was initially underwent ureteroscopy on 11/13 to treat a cluster of nonobstructing right renal stones. He developed right flank pain following stent removal and was found to have multiple remaining fragments in the right ureter. He elected to have ESWL to treat the leading stone in an effort to pass the remaining fragments. He returns today for KUB. He endorses passage of stone material for 3-4 days following the procedure. He denies continued flank pain, gross hematuria, dysuria, fever/chills, frequency/urgency. UA today is clear.   He completed a 24 hour urine in September that resulted with low urine volume, high uric acid, low magnesium, and high oxalate. Stone prevention strategies were discussed at that time including  increasing fluid intake with a goal of 2.5 L output daily, magnesium supplement, reducing oxalate intake, and starting allopurinol. He started allopurinol 2 weeks ago and has been tolerating well.   10/14/23: Patient returns today for follow up imaging after undergoing URS in November and ESWL in December. He has been doing well without recurrence of symptoms. He denies passage of additional stone material. Denies gross hematuria, dysuria, flank pain, fever/chills, nausea/vomiting. UA today clear.   01/27/2024  Patient doing well without any interval stone events. Continues on allopurinol. Renal ultrasound and KUB with a small left lower pole renal calculus.   05/02/2024: Seen today for evaluation of possible kidney stone event. For the past 2 weeks she has been having left lower back and flank pain/discomfort. Pain has been intermittent, no radiation into the lower abdomen or groin. He did have some gross hematuria couple days ago but none since then. He denies any known stone material passage. Symptoms also associated with increased urinary urgency, intermittency of stream as well as increased nocturia from baseline. He denies any correlating fevers or chills, nausea/vomiting.   06/09/2024: He returns for follow-up evaluation with imaging. At last exam KUB was inconclusive but medical expulsive therapy was initiated. UA today now with greater than 60 red blood cells per high-power field. He continues to be intermittently symptomatic. Pain typically in the left CVA area and flank. He has not passed any stone material, continues tamsulosin . No interval fevers or chills, nausea/vomiting. He did have gross hematuria this morning.     ALLERGIES: No Allergies    MEDICATIONS: Allopurinol 300 MG Tablet 1 tablet PO Daily  Lisinopril   Tamsulosin   HCl 0.4 MG Capsule 1 capsule PO Daily  Fenofibrate   HYDROcodone -Acetaminophen  5-325 MG Tablet 1 tablet PO Q 6 H PRN  Vitamin C  Vitamin D     GU PSH: Cysto Remove  Stent FB Sim - 08/26/2023, 06/11/2023       PSH Notes: No Surgical Problems   NON-GU PSH: Appendectomy Visit Complexity (formerly GPC1X) - 09/20/2023, 08/31/2023     GU PMH: Flank Pain - 05/02/2024, - 08/31/2023, - 04/19/2023 Renal calculus - 05/02/2024, - 01/27/2024, - 10/14/2023, - 09/20/2023 (Stable), - 08/26/2023, - 07/26/2023, - 06/11/2023, - 04/19/2023, Nephrolithiasis, - 2014 Ureteral calculus - 09/20/2023, Calculus of ureter, - 2014      PMH Notes:  1898-10-05 00:00:00 - Note: Normal Routine History And Physical Adult   NON-GU PMH: Personal history of other diseases of the circulatory system, History of hypertension - 2014 Personal history of other diseases of the nervous system and sense organs, History of sleep apnea - 2014 Hypercholesterolemia Hypertension Sleep Apnea    FAMILY HISTORY: Brain Cancer - Father Death In The Family Father - Father Family Health Status - Mother's Age - Mother Family Health Status Number - No Family History   SOCIAL HISTORY: Marital Status: Married Current Smoking Status: Patient has never smoked.   Tobacco Use Assessment Completed: Used Tobacco in last 30 days? Does not use smokeless tobacco. Does drink.  Drinks 1 caffeinated drink per day. 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    REVIEW OF SYSTEMS:    GU Review Male:   Patient denies frequent urination, hard to postpone urination, burning/ pain with urination, get up at night to urinate, leakage of urine, stream starts and stops, trouble starting your stream, have to strain to urinate , erection problems, and penile pain.  Gastrointestinal (Upper):   Patient denies indigestion/ heartburn, vomiting, and nausea.  Gastrointestinal (Lower):   Patient denies diarrhea and constipation.  Constitutional:   Patient denies fever, night sweats, weight loss, and fatigue.  Skin:   Patient denies skin rash/ lesion and itching.  Eyes:   Patient denies blurred vision and double vision.  Ears/ Nose/  Throat:   Patient denies sore throat and sinus problems.  Hematologic/Lymphatic:   Patient denies swollen glands and easy bruising.  Cardiovascular:   Patient denies leg swelling and chest pains.  Respiratory:   Patient denies cough and shortness of breath.  Endocrine:   Patient denies excessive thirst.  Musculoskeletal:   Patient reports back pain. Patient denies joint pain.  Neurological:   Patient denies headaches and dizziness.  Psychologic:   Patient denies depression and anxiety.   VITAL SIGNS:      06/09/2024 11:41 AM  Weight 216 lb / 97.98 kg  Height 70 in / 177.8 cm  BP 130/83 mmHg  Pulse 66 /min  BMI 31.0 kg/m   MULTI-SYSTEM PHYSICAL EXAMINATION:    Constitutional: Well-nourished. No physical deformities. Normally developed. Good grooming.  Neck: Neck symmetrical, not swollen. Normal tracheal position.  Respiratory: No labored breathing, no use of accessory muscles.   Cardiovascular: Normal temperature, normal extremity pulses, no swelling, no varicosities.  Skin: No paleness, no jaundice, no cyanosis. No lesion, no ulcer, no rash.  Neurologic / Psychiatric: Oriented to time, oriented to place, oriented to person. No depression, no anxiety, no agitation.  Gastrointestinal: No mass, no tenderness, no rigidity, non obese abdomen.  Musculoskeletal: Normal gait and station of head and neck.     Complexity of Data:  Source Of History:  Patient, Medical Record Summary  Records  Review:   Previous Doctor Records, Previous Hospital Records, Previous Patient Records  Urine Test Review:   Urinalysis, Urine Culture, 24 Hour Urine  X-Ray Review: KUB: Reviewed Films. Discussed With Patient.  Renal Ultrasound (Limited): Reviewed Films. Discussed With Patient.  C.T. Abdomen/Pelvis: Reviewed Films. Reviewed Report.     06/09/24  Urinalysis  Urine Appearance Cloudy   Urine Color Amber   Urine Glucose Neg mg/dL  Urine Bilirubin Neg mg/dL  Urine Ketones Neg mg/dL  Urine Specific  Gravity 1.020   Urine Blood 3+ ery/uL  Urine pH 6.0   Urine Protein 1+ mg/dL  Urine Urobilinogen 0.2 mg/dL  Urine Nitrites Neg   Urine Leukocyte Esterase Trace leu/uL  Urine WBC/hpf 0 - 5/hpf   Urine RBC/hpf >60/hpf   Urine Epithelial Cells NS (Not Seen)   Urine Bacteria Rare (0-9/hpf)   Urine Mucous Not Present   Urine Yeast NS (Not Seen)   Urine Trichomonas Not Present   Urine Cystals NS (Not Seen)   Urine Casts NS (Not Seen)   Urine Sperm Not Present    PROCEDURES:         Renal Ultrasound (Limited) - 23224  Lt Kidney: Length: 14.21 cm Depth: 6.63 cm Cortical Width: 1.17 cm Width: 6.04 cm    Left Kidney/Ureter:  Multiple calcs (one calc located in pelvis 0.8 cm)  Bladder:  PVR 35.02 ml      Patient confirmed No Neulasta OnPro Device.            KUB - [id number]  A single view of the abdomen is obtained. Thought to be a benign abnormality at time of last exam but on KUB today he continues with a cluster of opacity/calcifications consistent with a left renal pelvis/UPJ calculi. This is also confirmed on today's renal ultrasound.      Patient confirmed No Neulasta OnPro Device.           Urinalysis w/Scope Dipstick Dipstick Cont'd Micro  Color: Amber Bilirubin: Neg mg/dL WBC/hpf: 0 - 5/hpf  Appearance: Cloudy Ketones: Neg mg/dL RBC/hpf: >39/yeq  Specific Gravity: 1.020 Blood: 3+ ery/uL Bacteria: Rare (0-9/hpf)  pH: 6.0 Protein: 1+ mg/dL Cystals: NS (Not Seen)  Glucose: Neg mg/dL Urobilinogen: 0.2 mg/dL Casts: NS (Not Seen)    Nitrites: Neg Trichomonas: Not Present    Leukocyte Esterase: Trace leu/uL Mucous: Not Present      Epithelial Cells: NS (Not Seen)      Yeast: NS (Not Seen)      Sperm: Not Present    ASSESSMENT:      ICD-10 Details  1 GU:   Renal calculus - N20.0 Left, Acute, Complicated Injury   PLAN:            Medications New Meds: Ketorolac  Tromethamine  10 MG Tablet 1 tablet PO Q 8 H PRN For severe pain  #15  0 Refill(s)  Pharmacy Name:   [id number]  Address:  [address]   [place], [place] [address]  Phone:  [phone]  Fax:  [phone]            Orders Labs Urine Culture          Schedule Return Visit/Planned Activity: Next Available Appointment - Schedule Surgery          Document Letter(s):  Created for Patient: Clinical Summary         Notes:   Patient with symptomatic left renal pelvis calculi likely intermittently followed by having into the UPJ causing correlating pain and discomfort.  He has developed some recent hematuria which is correlated with today's UA. I reviewed with Dr. Carolee. The stone can be seen on KUB and correlated with the renal ultrasound findings. We discussed treatment moving forward, the patient prefers lithotripsy.   For shockwave lithotripsy I described the risks which include arrhythmia, kidney contusion, kidney hemorrhage, need for transfusion, pain, inability to break up stone, inability to pass stone fragments, Steinstrasse, infection associated with obstructing stones, need for different surgical procedure, need for repeat shockwave lithotripsy.   He will continue tamsulosin . He has some leftover hydrocodone  to take as well. I will send in some ketorolac  for him to use but he understands he will need to hold that for at least 3 days prior to shockwave lithotripsy. In the event he is able to pass stone material prior to shockwave procedure he will contact the office and arrange for follow-up for repeat evaluation. Precautionary culture sent today to serve as a baseline. He will be scheduled for next available left shockwave lithotripsy.    * Signed by Ubaldo Eagles, NP on 06/09/24 at 12:16 PM (EDT)*      The information contained in this medical record document is considered private and confidential patient information. This information can only be used for the medical diagnosis and/or medical services that are being provided by the patient's selected caregivers. This  information can only be distributed outside of the patient's care if the patient agrees and signs waivers of authorization for this information to be sent to an outside source or route.

## 2024-06-16 ENCOUNTER — Encounter (HOSPITAL_COMMUNITY): Payer: Self-pay | Admitting: Urology

## 2024-06-16 ENCOUNTER — Encounter (HOSPITAL_COMMUNITY)
Admission: RE | Disposition: A | Discharge status: Home / Self Care | Payer: Self-pay | Source: Home / Self Care | Attending: Urology

## 2024-06-16 ENCOUNTER — Other Ambulatory Visit: Payer: Self-pay

## 2024-06-16 ENCOUNTER — Ambulatory Visit (HOSPITAL_COMMUNITY)

## 2024-06-16 ENCOUNTER — Ambulatory Visit (HOSPITAL_COMMUNITY)
Admission: RE | Admit: 2024-06-16 | Discharge: 2024-06-16 | Disposition: A | Discharge status: Home / Self Care | Attending: Urology | Admitting: Urology

## 2024-06-16 DIAGNOSIS — Z79899 Other long term (current) drug therapy: Secondary | ICD-10-CM | POA: Diagnosis not present

## 2024-06-16 DIAGNOSIS — N200 Calculus of kidney: Secondary | ICD-10-CM | POA: Diagnosis present

## 2024-06-16 DIAGNOSIS — R310 Gross hematuria: Secondary | ICD-10-CM | POA: Insufficient documentation

## 2024-06-16 DIAGNOSIS — G4733 Obstructive sleep apnea (adult) (pediatric): Secondary | ICD-10-CM | POA: Diagnosis not present

## 2024-06-16 HISTORY — PX: EXTRACORPOREAL SHOCK WAVE LITHOTRIPSY: SHX1557

## 2024-06-16 SURGERY — LITHOTRIPSY, ESWL
Anesthesia: LOCAL | Laterality: Left

## 2024-06-16 MED ORDER — DIAZEPAM 5 MG PO TABS
10.0000 mg | ORAL_TABLET | ORAL | Status: AC
  Administered 2024-06-16: 10 mg via ORAL
  Filled 2024-06-16: qty 2

## 2024-06-16 MED ORDER — SODIUM CHLORIDE 0.9 % IV SOLN: INTRAVENOUS | Status: DC

## 2024-06-16 MED ORDER — DIPHENHYDRAMINE HCL 25 MG PO CAPS
25.0000 mg | ORAL_CAPSULE | ORAL | Status: AC
  Administered 2024-06-16: 25 mg via ORAL
  Filled 2024-06-16: qty 1

## 2024-06-16 MED ORDER — CIPROFLOXACIN HCL 500 MG PO TABS
500.0000 mg | ORAL_TABLET | ORAL | Status: AC
  Administered 2024-06-16: 500 mg via ORAL
  Filled 2024-06-16: qty 1

## 2024-06-16 MED ORDER — IBUPROFEN 200 MG PO TABS: 200.0000 mg | ORAL_TABLET | Freq: Four times a day (QID) | ORAL | Status: AC | PRN

## 2024-06-16 NOTE — Interval H&P Note (Signed)
 History and Physical Interval Note:  06/16/2024 1:16 PM  Noah Harris  has presented today for surgery, with the diagnosis of LEFT RENAL STONE.  The various methods of treatment have been discussed with the patient and family. After consideration of risks, benefits and other options for treatment, the patient has consented to  Procedure(s): LITHOTRIPSY, ESWL (Left) as a surgical intervention.  The patient's history has been reviewed, patient examined, no change in status, stable for surgery.  I have reviewed the patient's chart and labs. He is well. Hasn't seen a stone pass and continues to have left flank pain. No fever. Discussed equivocal KUB. Will flouro and see. Questions were answered to the patient's satisfaction.     Donnice Brooks

## 2024-06-16 NOTE — Op Note (Signed)
 LEFT 8 mm renal stone, 7 mm renal stone  LEFT ESWL   Findings: see [facility] OP note for full details. Stone located in the left kidney on flouro. Faded well. There was a second 7 mm stone we also targetted at 2200 shocks - 4000 shocks. This stone also faded well. He may need a staged procedure if the fails to pass the stones or the fragments.

## 2024-06-19 ENCOUNTER — Encounter (HOSPITAL_COMMUNITY): Payer: Self-pay | Admitting: Urology
